# Patient Record
Sex: Female | Born: 1995
Health system: Southern US, Community
[De-identification: ages and names within clinical notes are randomized; demographics above are authoritative.]

## PROBLEM LIST (undated history)

## (undated) DIAGNOSIS — E119 Type 2 diabetes mellitus without complications: Secondary | ICD-10-CM

## (undated) HISTORY — PX: WISDOM TOOTH EXTRACTION: SHX21

---
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
## 2015-10-08 ENCOUNTER — Emergency Department (HOSPITAL_COMMUNITY)
Admission: EM | Admit: 2015-10-08 | Discharge: 2015-10-08 | Disposition: A | Discharge status: Home / Self Care | Payer: Medicaid Other | Attending: Emergency Medicine | Admitting: Emergency Medicine

## 2015-10-08 ENCOUNTER — Encounter (HOSPITAL_COMMUNITY): Payer: Self-pay | Admitting: *Deleted

## 2015-10-08 DIAGNOSIS — Z3202 Encounter for pregnancy test, result negative: Secondary | ICD-10-CM | POA: Insufficient documentation

## 2015-10-08 DIAGNOSIS — E109 Type 1 diabetes mellitus without complications: Secondary | ICD-10-CM | POA: Insufficient documentation

## 2015-10-08 DIAGNOSIS — Z794 Long term (current) use of insulin: Secondary | ICD-10-CM | POA: Insufficient documentation

## 2015-10-08 DIAGNOSIS — R1031 Right lower quadrant pain: Secondary | ICD-10-CM | POA: Diagnosis not present

## 2015-10-08 DIAGNOSIS — Z79899 Other long term (current) drug therapy: Secondary | ICD-10-CM | POA: Insufficient documentation

## 2015-10-08 DIAGNOSIS — M7918 Myalgia, other site: Secondary | ICD-10-CM

## 2015-10-08 HISTORY — DX: Type 2 diabetes mellitus without complications: E11.9

## 2015-10-08 LAB — CBC
HEMATOCRIT: 44.9 % (ref 36.0–46.0)
HEMOGLOBIN: 14.9 g/dL (ref 12.0–15.0)
MCH: 28.7 pg (ref 26.0–34.0)
MCHC: 33.2 g/dL (ref 30.0–36.0)
MCV: 86.3 fL (ref 78.0–100.0)
Platelets: 319 10*3/uL (ref 150–400)
RBC: 5.2 MIL/uL — ABNORMAL HIGH (ref 3.87–5.11)
RDW: 12.7 % (ref 11.5–15.5)
WBC: 12.4 10*3/uL — AB (ref 4.0–10.5)

## 2015-10-08 LAB — URINALYSIS, ROUTINE W REFLEX MICROSCOPIC
Bilirubin Urine: NEGATIVE
Glucose, UA: 100 mg/dL — AB
Hgb urine dipstick: NEGATIVE
Ketones, ur: 15 mg/dL — AB
LEUKOCYTES UA: NEGATIVE
NITRITE: NEGATIVE
PH: 6 (ref 5.0–8.0)
PROTEIN: NEGATIVE mg/dL
SPECIFIC GRAVITY, URINE: 1.014 (ref 1.005–1.030)

## 2015-10-08 LAB — COMPREHENSIVE METABOLIC PANEL
ALBUMIN: 4.5 g/dL (ref 3.5–5.0)
ALT: 15 U/L (ref 14–54)
ANION GAP: 10 (ref 5–15)
AST: 17 U/L (ref 15–41)
Alkaline Phosphatase: 33 U/L — ABNORMAL LOW (ref 38–126)
BILIRUBIN TOTAL: 0.4 mg/dL (ref 0.3–1.2)
BUN: 11 mg/dL (ref 6–20)
CHLORIDE: 104 mmol/L (ref 101–111)
CO2: 24 mmol/L (ref 22–32)
Calcium: 9.8 mg/dL (ref 8.9–10.3)
Creatinine, Ser: 0.81 mg/dL (ref 0.44–1.00)
GFR calc Af Amer: >60 mL/min (ref >60)
Glucose, Bld: 196 mg/dL — ABNORMAL HIGH (ref 65–99)
POTASSIUM: 3.4 mmol/L — AB (ref 3.5–5.1)
Sodium: 138 mmol/L (ref 135–145)
TOTAL PROTEIN: 7.8 g/dL (ref 6.5–8.1)

## 2015-10-08 LAB — LIPASE, BLOOD: LIPASE: 20 U/L (ref 11–51)

## 2015-10-08 LAB — POC URINE PREG, ED: Preg Test, Ur: NEGATIVE

## 2015-10-08 MED ORDER — KETOROLAC TROMETHAMINE 30 MG/ML IJ SOLN
30.0000 mg | Freq: Once | INTRAMUSCULAR | Status: AC
Start: 2015-10-08 — End: 2015-10-08
  Administered 2015-10-08: 30 mg via INTRAMUSCULAR
  Filled 2015-10-08: qty 1

## 2015-10-08 NOTE — Discharge Instructions (Signed)
Take tylenol and motrin for pain control. Return without fail for worsening symptoms, including fever, vomiting and unable to keep down food/fluids, worsening pain, not wanting to eat or drink, or any other symptoms concerning to you.  Abdominal Pain, Adult Many things can cause belly (abdominal) pain. Most times, the belly pain is not dangerous. Many cases of belly pain can be watched and treated at home. HOME CARE   Do not take medicines that help you go poop (laxatives) unless told to by your doctor.  Only take medicine as told by your doctor.  Eat or drink as told by your doctor. Your doctor will tell you if you should be on a special diet. GET HELP IF:  You do not know what is causing your belly pain.  You have belly pain while you are sick to your stomach (nauseous) or have runny poop (diarrhea).  You have pain while you pee or poop.  Your belly pain wakes you up at night.  You have belly pain that gets worse or better when you eat.  You have belly pain that gets worse when you eat fatty foods.  You have a fever. GET HELP RIGHT AWAY IF:   The pain does not go away within 2 hours.  You keep throwing up (vomiting).  The pain changes and is only in the right or left part of the belly.  You have bloody or tarry looking poop. MAKE SURE YOU:   Understand these instructions.  Will watch your condition.  Will get help right away if you are not doing well or get worse.   This information is not intended to replace advice given to you by your health care provider. Make sure you discuss any questions you have with your health care provider.   Document Released: 04/15/2008 Document Revised: 11/18/2014 Document Reviewed: 07/07/2013 Elsevier Interactive Patient Education [employer]2016 Elsevier Inc.

## 2015-10-08 NOTE — ED Provider Notes (Signed)
CSN: [id number]     Arrival date & time 10/08/15  1245 History   First MD Initiated Contact with Patient 10/08/15 1459     Chief Complaint  Patient presents with  . Abdominal Pain     (Consider location/radiation/quality/duration/timing/severity/associated sxs/prior Treatment) HPI 19 year old female who presents with abdominal pain. Has a history of type 1 diabetes and no prior abdominal surgeries. States that yesterday after waking up, she developed right lower quadrant abdominal pain that is crampy and achy in nature. Pain initially worse with movement and position changes, but now is having pain more persistent at rest. Has not had any fever, chills, night sweats, nausea or vomiting, diarrhea, dysuria , urinary frequency, abnormal vaginal discharge or bleeding. Denies any prior exertional activity, trauma or falls, or any known triggers of pain. 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  Pain 7/10 in severity currently.   Past Medical History  Diagnosis Date  . Diabetes mellitus without complication [facility](HCC)    Past Surgical History  Procedure Laterality Date  . Wisdom tooth extraction     History reviewed. No pertinent family history. Social History  Substance Use Topics  . Smoking status: Never Smoker   . Smokeless tobacco: None  . Alcohol Use: No   OB History    No data available     Review of Systems 10/14 systems reviewed and are negative other than those stated in the HPI    Allergies  Morphine and Tetracycline  Home Medications   Prior to Admission medications   Medication Sig Start Date End Date Taking? Authorizing Provider  ibuprofen (ADVIL,MOTRIN) 800 MG tablet Take 800 mg by mouth every 8 (eight) hours as needed (pain).   Yes Historical Provider, MD  Insulin Human (INSULIN PUMP) SOLN Inject 1 each into the skin continuous. Basal rate 1.5   Yes Historical Provider, MD   levothyroxine (SYNTHROID, LEVOTHROID) 50 MCG tablet Take 50 mcg by mouth daily before breakfast.   Yes Historical Provider, MD  promethazine (PHENERGAN) 12.5 MG tablet Take 12.5 mg by mouth every 6 (six) hours as needed for nausea or vomiting.   Yes Historical Provider, MD   BP 108/59 mmHg  Pulse 92  Temp(Src) 98 F (36.7 C) (Oral)  Resp 16  SpO2 99%  LMP  Physical Exam Physical Exam  Nursing note and vitals reviewed. Constitutional: Well developed, well nourished, non-toxic, and in no acute distress Head: Normocephalic and atraumatic.  Mouth/Throat: Oropharynx is clear and moist.  Neck: Normal range of motion. Neck supple.  Cardiovascular: Normal rate and regular rhythm.   Pulmonary/Chest: Effort normal and breath sounds normal.  Abdominal: Soft. Non-distended. No CVA tenderness. Negative murphy's sign. Tender in RLQ.  There is no rebound and no guarding.  Musculoskeletal: Normal range of motion.  Neurological: Alert, no facial droop, fluent speech, moves all extremities symmetrically Skin: Skin is warm and dry.  Psychiatric: Cooperative  ED Course  Procedures (including critical care time) Labs Review Labs Reviewed  COMPREHENSIVE METABOLIC PANEL - Abnormal; Notable for the following:    Potassium 3.4 (*)    Glucose, Bld 196 (*)    Alkaline Phosphatase 33 (*)    All other components within normal limits  CBC - Abnormal; Notable for the following:    WBC 12.4 (*)    RBC 5.20 (*)    All other components within normal limits  URINALYSIS, ROUTINE W REFLEX MICROSCOPIC (NOT AT [facility]) - Abnormal;  Notable for the following:    Glucose, UA 100 (*)    Ketones, ur 15 (*)    All other components within normal limits  LIPASE, BLOOD  POC URINE PREG, ED   I have personally reviewed and evaluated these images and lab results as part of my medical decision-making.    MDM   Final diagnoses:  Right lower quadrant abdominal pain  Musculoskeletal pain    19 year old female with  history of type 1 diabetes who presents with right lower quadrant abdominal pain. On presentation she is well-appearing and in no acute distress. Vital signs are within normal limits. She on exam has a soft and benign abdomen. She does not have tenderness with palpation of her abdomen, and no tenderness at Burney's point. No other secondary signs of appendicitis either. States that her symptoms improve with palpation, and only really noticed her pain with movement of her abdomen. Seems less consistent with acute appendicitis or other acute intra-abdominal process. No pelvic symptoms and no adnexal tenderness and do not suspect pelvic etiology of symptoms. Has an unremarkable CBC, comp metabolic panel, lipase, and urinalysis. Is given a dose of Toradol, with resolution of symptoms. At this time, I do not believe that she requires imaging with CT, but is given her warning signs for appendicitis. Strict return and follow-up instructions are reviewed. She expressed understanding of all discharge instructions and felt comfortable to plan of care.    Lavera Guise, MD 10/09/15 0005

## 2015-10-08 NOTE — ED Notes (Addendum)
Visitor questioning if pt needs IV at this time. Pt denies NVD, tolerating PO food and fluids.

## 2015-10-08 NOTE — ED Notes (Signed)
Here with RLQ pain since yesterday and reports pain with movement.  Pt states achey feeling.  IDDM.

## 2015-10-08 NOTE — ED Notes (Signed)
Called pt for room placement. No answer. 

## 2016-11-12 DIAGNOSIS — E1065 Type 1 diabetes mellitus with hyperglycemia: Secondary | ICD-10-CM | POA: Diagnosis not present

## 2017-01-28 ENCOUNTER — Telehealth: Payer: Self-pay

## 2017-01-28 NOTE — Patient Outreach (Signed)
Called Ms. Jane to schedule a Link to Wellness appointment, there was no answer, left message for her to call me back.

## 2017-02-07 ENCOUNTER — Ambulatory Visit: Payer: Medicaid Other | Admitting: Internal Medicine

## 2017-02-10 ENCOUNTER — Ambulatory Visit: Payer: Medicaid Other | Admitting: Internal Medicine

## 2017-03-14 ENCOUNTER — Telehealth: Payer: Self-pay | Admitting: Behavioral Health

## 2017-03-14 NOTE — Telephone Encounter (Signed)
Unable to reach patient at time of Pre-Visit Call.  Left message for patient to return call when available.    

## 2017-03-16 NOTE — Progress Notes (Addendum)
[facility] [address] [place], [place] [address] [phone]  Date:  03/17/2017   Name:  Yvette Melendez   DOB:  05-17-1996   MRN:  [id number]  PCP:  Pearline Cables, MD    Chief Complaint: Establish Care (Pt here to est care. c/o possible hairline fracture right big toe. Also c/o 2 different episodes of chest pain that started 3 days ago.  Three episodes of dizziness that has happened  over the past  couple of weeks.  Will need refills today. )   History of Present Illness:  Yvette Melendez is a 21 y.o. very pleasant female patient who presents with the following:  Young woman here today to establish care She has DM 1 managed by endocrinology- however pt states that she does not see her last endocrinologist any more.  She had to reschedule her appts too much, and started working for [employer] so she wanted to change to an endocrinologist in our system.  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  She is able to manage this mostly n her own  Currently her novolog is done through her pump- she has a basal rate and carb counts as well Her glucose has been a little harder to control as she is working 12 hour shifts and her eating schedule has changed  She also had a concern about a broken toe - right great toe.  She noted onset of pain 6 weeks ago; however she is not sure of any acute injury. It never swelled or bruised.  However she has noted that this toe has been painful with walking and sometimes at rest for the last 6 weeks  LMP is current  Also, she notes that "a few nights ago I was having stabbing chest pain all night at work, and then a little bit the next day."  This occurred this past Friday- today is Monday. No CP since/ no current CP.  No SOB No fever or cough No history of PE or DVT Never a smoker Not on any OCP  She admits  to feeling stressed. She is not sure if stress has caused her sx  BP Readings from Last 3 Encounters:  03/17/17 118/62  10/08/15 108/59   Patient Active Problem List   Diagnosis Date Noted  . CLOSED LATERAL DISLOCATION OF ELBOW 02/21/2010    Past Medical History:  Diagnosis Date  . Diabetes mellitus without complication [facility])     Past Surgical History:  Procedure Laterality Date  . WISDOM TOOTH EXTRACTION      Social History  Substance Use Topics  . Smoking status: Never Smoker  . Smokeless tobacco: Not on file  . Alcohol use No    No family history on file.  Allergies  Allergen Reactions  . Morphine Nausea And Vomiting  . Tetracycline Other (See Comments)    Unknown    Medication list has been reviewed and updated.  Current Outpatient Prescriptions on File Prior to Visit  Medication Sig Dispense Refill  . ibuprofen (ADVIL,MOTRIN) 800 MG tablet Take 800 mg by mouth every 8 (eight) hours as needed (pain).    . Insulin Human (INSULIN PUMP) SOLN Inject 1 each into the skin continuous. Basal rate 1.5    . levothyroxine (SYNTHROID, LEVOTHROID) 50 MCG tablet Take 50 mcg  by mouth daily before breakfast.    . promethazine (PHENERGAN) 12.5 MG tablet Take 12.5 mg by mouth every 6 (six) hours as needed for nausea or vomiting.     No current facility-administered medications on file prior to visit.     Review of Systems:  As per HPI- otherwise negative.   Physical Examination: Vitals:   03/17/17 1326  BP: 118/62  Pulse: 96  Temp: 97.6 F (36.4 C)   Vitals:   03/17/17 1326  Weight: 162 lb 9.6 oz (73.8 kg)  Height: 5\' 3"  (1.6 m)   Body mass index is 28.8 kg/m. Ideal Body Weight: Weight in (lb) to have BMI = 25: 140.8  GEN: WDWN, NAD, Non-toxic, A & O x 3, overweight, otherwise looks well HEENT: Atraumatic, Normocephalic. Neck supple. No masses, No LAD.  Bilateral TM wnl, oropharynx normal.  PEERL,EOMI.   Ears and Nose: No external deformity. CV: RRR, No  M/G/R. No JVD. No thrill. No extra heart sounds. PULM: CTA B, no wheezes, crackles, rhonchi. No retractions. No resp. distress. No accessory muscle use. ABD: S, NT, ND, +BS. No rebound. No HSM.  Benign belly EXTR: No c/c/e NEURO Normal gait.  PSYCH: Normally interactive. Conversant. Not depressed or anxious appearing.  Calm demeanor.   Dg Chest 2 View  Result Date: 03/17/2017 CLINICAL DATA:  Chest pain EXAM: CHEST  2 VIEW COMPARISON:  None. FINDINGS: Lungs are clear. Heart size and pulmonary vascularity are normal. No adenopathy. No pneumothorax. No bone lesions. IMPRESSION: No abnormality noted. Electronically Signed   By: Bretta BangWilliam  Woodruff III M.D.   On: 03/17/2017 14:35   Dg Foot Complete Right  Result Date: 03/17/2017 CLINICAL DATA:  Pain EXAM: RIGHT FOOT COMPLETE - 3+ VIEW COMPARISON:  None. FINDINGS: Frontal, oblique, and lateral views were obtained. No fracture or dislocation. No appreciable arthropathy. No erosive change. IMPRESSION: No fracture or dislocation.  No appreciable arthropathy. Electronically Signed   By: Bretta BangWilliam  Woodruff III M.D.   On: 03/17/2017 14:36   EKG: sinus rhythm, no concerning findings  Assessment and Plan: Pain of right great toe - Plan: DG Foot Complete Right  Chest pain, unspecified type - Plan: DG Chest 2 View, EKG 12-Lead  Type 1 diabetes mellitus without complications (HCC) - Plan: Ambulatory referral to Endocrinology, CBC, Comprehensive metabolic panel, Hemoglobin A1c  Hypothyroidism due to acquired atrophy of thyroid - Plan: TSH  Here today to establish care and discus several concerns Pt had thought that I could manage her insulin pump- let her know that we will need her to establish with endocrinology, and make a referral for her Due for an A1c and TSH today Toe pain, NKI- will check toe film Concern about CP a few days ago, now resolved.  Offered cardiology referral but she prefers to see if it returns which is reasonable given her age Will check  a CXR for her today  Will plan further follow- up pending labs.   Signed Abbe AmsterdamJessica Blessed Cotham, MD  Received her labs  Results for orders placed or performed in visit on 03/17/17  CBC  Result Value Ref Range   WBC 12.3 (H) 4.0 - 10.5 K/uL   RBC 4.89 3.87 - 5.11 Mil/uL   Platelets 343.0 150.0 - 400.0 K/uL   Hemoglobin 14.0 12.0 - 15.0 g/dL   HCT 16.142.5 09.636.0 - 04.546.0 %   MCV 87.0 78.0 - 100.0 fl   MCHC 32.8 30.0 - 36.0 g/dL   RDW 40.913.3 81.111.5 - 91.415.5 %  Comprehensive metabolic  panel  Result Value Ref Range   Sodium 134 (L) 135 - 145 mEq/L   Potassium 4.0 3.5 - 5.1 mEq/L   Chloride 99 96 - 112 mEq/L   CO2 28 19 - 32 mEq/L   Glucose, Bld 385 (H) 70 - 99 mg/dL   BUN 11 6 - 23 mg/dL   Creatinine, Ser 4.09 0.40 - 1.20 mg/dL   Total Bilirubin 0.4 0.2 - 1.2 mg/dL   Alkaline Phosphatase 28 (L) 39 - 117 U/L   AST 9 0 - 37 U/L   ALT 8 0 - 35 U/L   Total Protein 6.9 6.0 - 8.3 g/dL   Albumin 4.4 3.5 - 5.2 g/dL   Calcium 9.5 8.4 - 81.1 mg/dL   GFR 914.78 >29.56 mL/min  Hemoglobin A1c  Result Value Ref Range   Hgb A1c MFr Bld 8.8 (H) 4.6 - 6.5 %  TSH  Result Value Ref Range   TSH 1.98 0.35 - 4.50 uIU/mL   Message to pt- will need to repeat CBC in one month DM is not controlled- proceed with endocrinology referral TSH is ok

## 2017-03-17 ENCOUNTER — Encounter: Payer: Self-pay | Admitting: Family Medicine

## 2017-03-17 ENCOUNTER — Ambulatory Visit (INDEPENDENT_AMBULATORY_CARE_PROVIDER_SITE_OTHER): Payer: 59 | Admitting: Family Medicine

## 2017-03-17 ENCOUNTER — Ambulatory Visit (HOSPITAL_BASED_OUTPATIENT_CLINIC_OR_DEPARTMENT_OTHER)
Admission: RE | Admit: 2017-03-17 | Discharge: 2017-03-17 | Disposition: A | Discharge status: Home / Self Care | Payer: 59 | Source: Ambulatory Visit | Attending: Family Medicine | Admitting: Family Medicine

## 2017-03-17 VITALS — BP 118/62 | HR 96 | Temp 97.6°F | Ht 63.0 in | Wt 162.6 lb

## 2017-03-17 DIAGNOSIS — M79674 Pain in right toe(s): Secondary | ICD-10-CM

## 2017-03-17 DIAGNOSIS — D72829 Elevated white blood cell count, unspecified: Secondary | ICD-10-CM

## 2017-03-17 DIAGNOSIS — R079 Chest pain, unspecified: Secondary | ICD-10-CM

## 2017-03-17 DIAGNOSIS — M79671 Pain in right foot: Secondary | ICD-10-CM | POA: Diagnosis not present

## 2017-03-17 DIAGNOSIS — E109 Type 1 diabetes mellitus without complications: Secondary | ICD-10-CM

## 2017-03-17 DIAGNOSIS — E034 Atrophy of thyroid (acquired): Secondary | ICD-10-CM

## 2017-03-17 LAB — COMPREHENSIVE METABOLIC PANEL
ALT: 8 U/L (ref 0–35)
AST: 9 U/L (ref 0–37)
Albumin: 4.4 g/dL (ref 3.5–5.2)
Alkaline Phosphatase: 28 U/L — ABNORMAL LOW (ref 39–117)
BUN: 11 mg/dL (ref 6–23)
CALCIUM: 9.5 mg/dL (ref 8.4–10.5)
CHLORIDE: 99 meq/L (ref 96–112)
CO2: 28 meq/L (ref 19–32)
CREATININE: 0.72 mg/dL (ref 0.40–1.20)
GFR: 108.34 mL/min (ref >60.00)
Glucose, Bld: 385 mg/dL — ABNORMAL HIGH (ref 70–99)
POTASSIUM: 4 meq/L (ref 3.5–5.1)
Sodium: 134 mEq/L — ABNORMAL LOW (ref 135–145)
Total Bilirubin: 0.4 mg/dL (ref 0.2–1.2)
Total Protein: 6.9 g/dL (ref 6.0–8.3)

## 2017-03-17 LAB — CBC
HEMATOCRIT: 42.5 % (ref 36.0–46.0)
Hemoglobin: 14 g/dL (ref 12.0–15.0)
MCHC: 32.8 g/dL (ref 30.0–36.0)
MCV: 87 fl (ref 78.0–100.0)
PLATELETS: 343 10*3/uL (ref 150.0–400.0)
RBC: 4.89 Mil/uL (ref 3.87–5.11)
RDW: 13.3 % (ref 11.5–15.5)
WBC: 12.3 10*3/uL — ABNORMAL HIGH (ref 4.0–10.5)

## 2017-03-17 LAB — TSH: TSH: 1.98 u[IU]/mL (ref 0.35–4.50)

## 2017-03-17 LAB — HEMOGLOBIN A1C: HEMOGLOBIN A1C: 8.8 % — AB (ref 4.6–6.5)

## 2017-03-17 NOTE — Addendum Note (Signed)
Addended by: Abbe AmsterdamOPLAND, Christy Friede C on: 03/17/2017 07:56 PM   Modules accepted: Orders

## 2017-03-17 NOTE — Patient Instructions (Signed)
It was nice to see you today- I will be in touch with your labs asap We will also refer you to endocrinology to help us with your insulin pump and diabetes management I will be in touch with your toe and chest films later on today  If you have any more episodes of chest pain please let me know

## 2017-04-14 ENCOUNTER — Telehealth: Payer: Self-pay | Admitting: Family Medicine

## 2017-04-14 ENCOUNTER — Encounter: Payer: Self-pay | Admitting: Family Medicine

## 2017-04-14 NOTE — Telephone Encounter (Signed)
Caller name: Relationship to patient: Self Can be reached: [phone]  Pharmacy:  Reason for call: Patient [facility] so that she can have it drawn while at work today. States they can not see the order in EPIC. Did not have the number but [facility] needs to be [facility].

## 2017-04-14 NOTE — Telephone Encounter (Signed)
Called number- they are not able to see her order in Epic. Will fax an order instead  [phone]   Attn to lab

## 2017-04-14 NOTE — Telephone Encounter (Signed)
Number to [facility] is [phone] to give the verbal

## 2017-04-15 ENCOUNTER — Other Ambulatory Visit (HOSPITAL_COMMUNITY)
Admission: RE | Admit: 2017-04-15 | Discharge: 2017-04-15 | Disposition: A | Discharge status: Home / Self Care | Payer: 59 | Source: Ambulatory Visit | Attending: Family Medicine | Admitting: Family Medicine

## 2017-04-15 DIAGNOSIS — D72829 Elevated white blood cell count, unspecified: Secondary | ICD-10-CM | POA: Insufficient documentation

## 2017-04-15 LAB — CBC
HEMATOCRIT: 41.2 % (ref 36.0–46.0)
HEMOGLOBIN: 14.3 g/dL (ref 12.0–15.0)
MCH: 29.2 pg (ref 26.0–34.0)
MCHC: 34.7 g/dL (ref 30.0–36.0)
MCV: 84.3 fL (ref 78.0–100.0)
Platelets: 367 10*3/uL (ref 150–400)
RBC: 4.89 MIL/uL (ref 3.87–5.11)
RDW: 12.3 % (ref 11.5–15.5)
WBC: 10.6 10*3/uL — AB (ref 4.0–10.5)

## 2017-04-18 MED FILL — PROMETHAZINE 12.5 MG TABLET: 12.5 | 5 days supply | Qty: 20 | Fill #0

## 2017-04-18 MED FILL — LANTUS 100 UNITS/ML VIAL: 100 | 28 days supply | Qty: 10 | Fill #0

## 2017-04-21 ENCOUNTER — Telehealth: Payer: Self-pay | Admitting: Family Medicine

## 2017-04-21 NOTE — Telephone Encounter (Signed)
Relation to VQ:QVZDpt:self Call back number:[phone] Pharmacy: [facility] [place], [place] - [address] [phone]) [phone])     Reason for call:  Patient states insurance will not cover insulin aspart (NOVOLOG) 100 UNIT/ML injection and would like PCP to prescribe "humalog" and requesting ibuprofen (ADVIL,MOTRIN) 800 MG tablet, please advise

## 2017-04-22 MED ORDER — INSULIN LISPRO 100 UNIT/ML ~~LOC~~ SOLN: SUBCUTANEOUS | 11 refills | Status: DC

## 2017-04-22 MED ORDER — IBUPROFEN 800 MG PO TABS: 800.0000 mg | ORAL_TABLET | Freq: Three times a day (TID) | ORAL | 3 refills | Status: DC | PRN

## 2017-04-22 NOTE — Telephone Encounter (Signed)
Called pt to clarity- she is using novolog in her pump, but this is no longer covered.  Per pharmacy humalog is a covered option for her.  humalog and novolog do have the same rate of onset and halt life, so this substitution should be fine.  Clarified with pt how much she needs per month and did rx for her.  Also refilled her ibuprofen 800 to use prn  Meds ordered this encounter  Medications  . insulin lispro (HUMALOG) 100 UNIT/ML injection    Sig: Use in insulin pump as directed- up to 80 units a day    Dispense:  30 mL    Refill:  11  . ibuprofen (ADVIL,MOTRIN) 800 MG tablet    Sig: Take 1 tablet (800 mg total) by mouth every 8 (eight) hours as needed (pain).    Dispense:  60 tablet    Refill:  3

## 2017-04-25 ENCOUNTER — Other Ambulatory Visit: Payer: Self-pay | Admitting: Emergency Medicine

## 2017-04-25 ENCOUNTER — Telehealth: Payer: Self-pay | Admitting: Emergency Medicine

## 2017-04-25 MED ORDER — IBUPROFEN 800 MG PO TABS: 800.0000 mg | ORAL_TABLET | Freq: Three times a day (TID) | ORAL | 3 refills | Status: DC | PRN

## 2017-04-25 MED ORDER — INSULIN LISPRO 100 UNIT/ML ~~LOC~~ SOLN: SUBCUTANEOUS | 11 refills | Status: DC

## 2017-04-25 MED FILL — IBUPROFEN 800 MG TAB: 800 | 20 days supply | Qty: 60 | Fill #0

## 2017-04-25 MED FILL — HumaLOG 100 UNIT/ML SOLN: 100 | 25 days supply | Qty: 20 | Fill #0

## 2017-04-25 NOTE — Telephone Encounter (Signed)
Received ref[facility] from [facility] for insulin lispro (HUMALOG)100 UNIT/ML injeciton and ibuprofen (ADVIL, MOTRIN) 800 MG tablet. Both were filled on 04/22/17 and sent to [facility]. Called pt to find out if she has meds already or if they need to be sent to [facility].  Called pt, no answer left message for pt to return call to inform if I need to disregard the request received from [facility].

## 2017-04-25 NOTE — Telephone Encounter (Signed)
Patient returned call and states she prefers [facility], as of yet she has not received Rx from [facility] . Patient said Thank You So much.

## 2017-04-25 NOTE — Telephone Encounter (Signed)
Called [facility] to see if prescriptions had been mailed to pt yet. Informed by [facility] that meds have not been shipped and were awaiting pt to complete registration process. Informed [facility] that pt will pick up from local pharmacy. [facility] asked me to  Inform pt that she will need to call in to complete registration in order to have future prescriptions mailed.   Called and informed pt. Pt verbalized understanding and will call [facility] to complete registration. [facility] as requested by pt.

## 2017-04-29 ENCOUNTER — Encounter: Payer: Self-pay | Admitting: Endocrinology

## 2017-04-29 MED FILL — AZITHROMYCIN 250 MG TAB: 250 | 5 days supply | Qty: 6 | Fill #0

## 2017-04-29 MED FILL — FLUTICASONE PROP 50 MCG SPR: 50 | 30 days supply | Qty: 16 | Fill #0

## 2017-08-15 ENCOUNTER — Encounter: Payer: Self-pay | Admitting: Family Medicine

## 2017-08-19 NOTE — Progress Notes (Signed)
[place] at [facility] [address], [place], [place] [address] [phone]  Date:  08/21/2017   Name:  Yvette Melendez   DOB:  [birth year]   MRN:  [id number]  PCP:  Darreld Mclean, MD    Chief Complaint: Contraception   History of Present Illness:  Yvette Melendez is a 21 y.o. very pleasant female patient who presents with the following: I last saw her in May of this year:  Young woman here today to establish care She has DM 1 managed by endocrinology- however pt states that she does not see her last endocrinologist any more.  She had to reschedule her appts too much, and started working for [employer] so she wanted to change to an endocrinologist in our system.  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  She is able to manage this mostly n her own  Currently her novolog is done through her pump- she has a basal rate and carb counts as well Her glucose has been a little harder to control as she is working 12 hour shifts and her eating schedule has changed  She plans to call endocrinologyto discuss her diabetes Lab Results  Component Value Date   HGBA1C 8.8 (H) 03/17/2017    Here today to discuss contraception- she has never used hormonal contraception in the past but would like to start OCP. She and her SO are planning to have intercourse- they have not done so as of yet.  Pap: none yet as she has not yet had intercourse   Menses can be irregular- occur every 2-3 months.  She will have cramping, pain, nausea when she has her menses - she hopes that OCP will help with these sx as well  She has not had any cancers, never had a DVT/ PE, or migraine with aura, never a smoker  LMP was last month.    She needs a couple of refills today- phenergan and her glucacon emergency kit She does plan to establish care with endocrinology.  She plans to  have an A1c drawn at her work-place soon; I gave her an order for this   Patient Active Problem List   Diagnosis Date Noted  . CLOSED LATERAL DISLOCATION OF ELBOW 02/21/2010    Past Medical History:  Diagnosis Date  . Diabetes mellitus without complication [facility])     Past Surgical History:  Procedure Laterality Date  . WISDOM TOOTH EXTRACTION      Social History  Substance Use Topics  . Smoking status: Never Smoker  . Smokeless tobacco: Never Used  . Alcohol use No    No family history on file.  Allergies  Allergen Reactions  . Morphine Nausea And Vomiting  . Tetracycline Other (See Comments)    Unknown    Medication list has been reviewed and updated.  Current Outpatient Prescriptions on File Prior to Visit  Medication Sig Dispense Refill  . ibuprofen (ADVIL,MOTRIN) 800 MG tablet Take 1 tablet (800 mg total) by mouth every 8 (eight) hours as needed (pain). 60 tablet 3  . insulin glargine (LANTUS) 100 UNIT/ML injection INJECT 17 UNITS UNDER THE SKIN DAILY ONLY IN CASE OF PUMP MALFUNCTION    . Insulin Human (INSULIN PUMP) SOLN Inject 1 each into the skin continuous. Basal rate 1.5    . insulin lispro (HUMALOG)  100 UNIT/ML injection Use in insulin pump as directed- up to 80 units a day 30 mL 11  . levothyroxine (SYNTHROID, LEVOTHROID) 50 MCG tablet Take 50 mcg by mouth daily before breakfast.     No current facility-administered medications on file prior to visit.     Review of Systems:  As per HPI- otherwise negative. No fever or chills  BP Readings from Last 3 Encounters:  08/21/17 130/80  03/17/17 118/62  10/08/15 108/59      Physical Examination: Vitals:   08/21/17 1348  BP: 130/80  Pulse: 100  Temp: 98.3 F (36.8 C)  SpO2: 98%   Vitals:   08/21/17 1348  Weight: 162 lb (73.5 kg)  Height: 5' 3"  (1.6 m)   Body mass index is 28.7 kg/m. Ideal Body Weight: Weight in (lb) to have BMI = 25: 140.8  GEN: WDWN, NAD, Non-toxic, A & O x 3, overweight,  looks well HEENT: Atraumatic, Normocephalic. Neck supple. No masses, No LAD. Ears and Nose: No external deformity. CV: RRR, No M/G/R. No JVD. No thrill. No extra heart sounds. PULM: CTA B, no wheezes, crackles, rhonchi. No retractions. No resp. distress. No accessory muscle use. ABD: S, NT, ND, +BS. No rebound. No HSM. EXTR: No c/c/e NEURO Normal gait.  PSYCH: Normally interactive. Conversant. Not depressed or anxious appearing.  Calm demeanor.   Results for orders placed or performed in visit on 08/21/17  POCT urine pregnancy  Result Value Ref Range   Preg Test, Ur Negative Negative    Assessment and Plan: Contraceptive use education - Plan: POCT urine pregnancy, levonorgestrel-ethinyl estradiol (AVIANE,ALESSE,LESSINA) 0.1-20 MG-MCG tablet  Type 1 diabetes mellitus without complications ([name]) - Plan: glucagon 1 MG injection  Nausea - Plan: promethazine (PHENERGAN) 12.5 MG tablet  Would like to start on OCP- discussed in detail with her. She will take once daily,and recommended condoms as well for back-up protection.  She will come and see [place] for a pap once she is sexually active   Signed Lamar Blinks, MD

## 2017-08-21 ENCOUNTER — Ambulatory Visit (INDEPENDENT_AMBULATORY_CARE_PROVIDER_SITE_OTHER): Payer: 59 | Admitting: Family Medicine

## 2017-08-21 ENCOUNTER — Encounter: Payer: Self-pay | Admitting: Family Medicine

## 2017-08-21 VITALS — BP 130/80 | HR 100 | Temp 98.3°F | Ht 63.0 in | Wt 162.0 lb

## 2017-08-21 DIAGNOSIS — R110 Nausea: Secondary | ICD-10-CM

## 2017-08-21 DIAGNOSIS — E109 Type 1 diabetes mellitus without complications: Secondary | ICD-10-CM

## 2017-08-21 DIAGNOSIS — Z3009 Encounter for other general counseling and advice on contraception: Secondary | ICD-10-CM

## 2017-08-21 LAB — POCT URINE PREGNANCY: PREG TEST UR: NEGATIVE

## 2017-08-21 MED ORDER — GLUCAGON (RDNA) 1 MG IJ KIT: 1.0000 mg | PACK | Freq: Once | INTRAMUSCULAR | 12 refills | Status: AC | PRN

## 2017-08-21 MED ORDER — PROMETHAZINE HCL 12.5 MG PO TABS: 12.5000 mg | ORAL_TABLET | Freq: Four times a day (QID) | ORAL | 1 refills | Status: DC | PRN

## 2017-08-21 MED ORDER — LEVONORGESTREL-ETHINYL ESTRAD 0.1-20 MG-MCG PO TABS: 1.0000 | ORAL_TABLET | Freq: Every day | ORAL | 4 refills | Status: DC

## 2017-08-21 MED FILL — GLUCAGON 1 MG EMERGENCY KIT: 1 | 1 days supply | Qty: 1 | Fill #0

## 2017-08-21 MED FILL — PROMETHAZINE 12.5 MG TABLET: 12.5 | 7 days supply | Qty: 30 | Fill #0

## 2017-08-21 MED FILL — HumaLOG 100 UNIT/ML SOLN: 100 | 25 days supply | Qty: 20 | Fill #1

## 2017-08-21 MED FILL — IBUPROFEN 800 MG TABS: 800 | 20 days supply | Qty: 60 | Fill #1

## 2017-08-21 MED FILL — LEVONOR-ETH ESTRAD 0.1-0.02: 0.1-20 | 84 days supply | Qty: 84 | Fill #0

## 2017-08-21 NOTE — Patient Instructions (Addendum)
We will have you start on birth control pills- take one daily at about the same time Let me know if you have any problems or concerns!   We can do your pap for you in a few months

## 2017-10-06 MED FILL — PROMETHAZINE 12.5 MG TABLET: 12.5 | 7 days supply | Qty: 30 | Fill #1

## 2017-10-06 MED FILL — IBUPROFEN 800 MG TAB: 800 | 20 days supply | Qty: 60 | Fill #2

## 2017-10-06 MED FILL — HumaLOG 100 UNIT/ML SOLN: 100 | 25 days supply | Qty: 20 | Fill #2

## 2017-10-23 ENCOUNTER — Telehealth: Payer: Self-pay | Admitting: Family Medicine

## 2017-10-23 ENCOUNTER — Other Ambulatory Visit: Payer: Self-pay | Admitting: Family Medicine

## 2017-10-23 DIAGNOSIS — R110 Nausea: Secondary | ICD-10-CM

## 2017-10-23 DIAGNOSIS — Z3009 Encounter for other general counseling and advice on contraception: Secondary | ICD-10-CM

## 2017-10-23 MED ORDER — LEVONORGESTREL-ETHINYL ESTRAD 0.1-20 MG-MCG PO TABS: 1.0000 | ORAL_TABLET | Freq: Every day | ORAL | 4 refills | Status: AC

## 2017-10-23 NOTE — Telephone Encounter (Signed)
Pt requesting refill on lorazepam. Please advise.

## 2017-10-23 NOTE — Telephone Encounter (Signed)
Called her back-  She is not on ativan.  It is actually her OCP that she needs. She takes this on a continuou basis (skips the placebo) and this causes her to run out before her 90 days are up. I will rx 4 packs for a 90 day supply for her in resolve this issue

## 2017-10-23 NOTE — Telephone Encounter (Signed)
Controlled substance 

## 2017-10-23 NOTE — Telephone Encounter (Signed)
Copied from CRM #[id number]. Topic: Quick Communication - Rx Refill/Question >> Oct 23, 2017  9:14 AM Clack, Princella PellegriniJessica D wrote: Has the patient contacted their pharmacy? Yes.     (Agent: If no, request that the patient contact the pharmacy for the refill.)   Preferred Pharmacy (with phone number or street name): [facility] - [place], [place] - [address] [phone] (Phone) [phone] (Fax)  Pt states she would like to know if [facility] could update her refill, she is only able to refill them every 21 days and states that she does not have enough to last her the 21 days. LORazepam (ATIVAN) 1 MG tablet [[id number]][[id number]]    Agent: Please be advised that RX refills may take up to 3 business days. We ask that you follow-up with your pharmacy.

## 2017-10-26 MED FILL — HumaLOG 100 UNIT/ML SOLN: 100 | 25 days supply | Qty: 20 | Fill #3

## 2017-11-03 MED FILL — IBUPROFEN 800 MG TAB: 800 | 20 days supply | Qty: 60 | Fill #3

## 2017-11-03 MED FILL — PROMETHAZINE 12.5 MG TABLET: 12.5 | 7 days supply | Qty: 30 | Fill #0

## 2017-11-03 MED FILL — LEVONOR-ETH ESTRAD 0.1-0.02: 0.1-20 | 84 days supply | Qty: 84 | Fill #1

## 2017-11-12 MED FILL — AZITHROMYCIN 250 MG TAB: 250 | 5 days supply | Qty: 6 | Fill #0

## 2017-11-21 ENCOUNTER — Telehealth: Payer: Self-pay | Admitting: *Deleted

## 2017-11-21 NOTE — Telephone Encounter (Signed)
Received Physician Orders from [employer] for CMN for replacement pump; forwarded to provider/SLS 01/11

## 2017-11-25 ENCOUNTER — Encounter: Payer: Self-pay | Admitting: Family Medicine

## 2017-12-06 NOTE — Progress Notes (Addendum)
[facility] [address], [address] [place], [place] [address] [phone] [phone]  Date:  12/08/2017   Name:  Yvette GrosserMegan A Karabin   DOB:  Dec 15, 1995   MRN:  [id number]  PCP:  Pearline Cablesopland, Khadeja Abt C, MD    Chief Complaint: Diabetes (Pt here to f/ )   History of Present Illness:  Yvette GrosserMegan A Lamar is a 22 y.o. very pleasant female patient who presents with the following:  Pt with DM1 on an insulin pump.  I have seen her a couple of times over the last 12 months - May and October of 2018.  At both visits she stated that she planned to re-establish with endocrinology soon (I placed a referral for her in May) but has not done so. Also did not get her A1c drawn as we had discussed.  I recently received forms from [employer] to re-order an insulin pump for her.  I called pt to discuss need for endocrinology care, as I am not comfortable managing her insulin pump long term.  Will go through forms with her today so she will not go untreated.   She is changing to a brand new type of pump - her current pump is being discontinued in the [place], and the company sent her forms to she can get a similar pump from a different manufacturer She is overdue for labs today as well   Lab Results  Component Value Date   HGBA1C 8.8 (H) 03/17/2017   Lab Results  Component Value Date   TSH 1.98 03/17/2017     Patient Active Problem List   Diagnosis Date Noted  . CLOSED LATERAL DISLOCATION OF ELBOW 02/21/2010    Past Medical History:  Diagnosis Date  . Diabetes mellitus without complication [facility](HCC)     Past Surgical History:  Procedure Laterality Date  . WISDOM TOOTH EXTRACTION      Social History   Tobacco Use  . Smoking status: Never Smoker  . Smokeless tobacco: Never Used  Substance Use Topics  . Alcohol use: No  . Drug use: No    History reviewed. No pertinent family history.  Allergies  Allergen Reactions  . Morphine Nausea And Vomiting  .  Tetracycline Other (See Comments)    Unknown    Medication list has been reviewed and updated.  Current Outpatient Medications on File Prior to Visit  Medication Sig Dispense Refill  . glucagon 1 MG injection Inject 1 mg into the muscle once as needed. 1 each 12  . ibuprofen (ADVIL,MOTRIN) 800 MG tablet Take 1 tablet (800 mg total) by mouth every 8 (eight) hours as needed (pain). 60 tablet 3  . insulin glargine (LANTUS) 100 UNIT/ML injection INJECT 17 UNITS UNDER THE SKIN DAILY ONLY IN CASE OF PUMP MALFUNCTION    . Insulin Human (INSULIN PUMP) SOLN Inject 1 each into the skin continuous. Basal rate 1.5    . levonorgestrel-ethinyl estradiol (AVIANE,ALESSE,LESSINA) 0.1-20 MG-MCG tablet Take 1 tablet by mouth daily. Pt does not use placebo pills.  Please dispense 4 packs for a 90 day supply 4 Package 4  . levothyroxine (SYNTHROID, LEVOTHROID) 50 MCG tablet Take 50 mcg by mouth daily before breakfast.    . promethazine (PHENERGAN) 12.5 MG tablet TAKE 1 TABLET BY MOUTH EVERY 6 HOURS AS NEEDED FOR NAUSEA OR VOMITING 30 tablet 1   No current facility-administered medications on file prior to visit.     Review of Systems:  As per HPI-  otherwise negative.   Physical Examination: Vitals:   12/08/17 1014  BP: 112/72  Pulse: (!) 106  Temp: 98 F (36.7 C)  SpO2: 99%   Vitals:   12/08/17 1014  Weight: 163 lb 3.2 oz (74 kg)   Body mass index is 28.91 kg/m. Ideal Body Weight:    GEN: WDWN, NAD, Non-toxic, A & O x 3, overweight, looks well  HEENT: Atraumatic, Normocephalic. Neck supple. No masses, No LAD. Ears and Nose: No external deformity. CV: RRR, No M/G/R. No JVD. No thrill. No extra heart sounds. PULM: CTA B, no wheezes, crackles, rhonchi. No retractions. No resp. distress. No accessory muscle use. ABD: S, NT, ND, +BS. No rebound. No HSM. EXTR: No c/c/e NEURO Normal gait.  PSYCH: Normally interactive. Conversant. Not depressed or anxious appearing.  Calm demeanor.     Assessment and Plan: Type 1 diabetes mellitus without complications (HCC) - Plan: Hemoglobin A1c, Ambulatory referral to Endocrinology, Basic metabolic panel, insulin lispro (HUMALOG) 100 UNIT/ML injection  Hypothyroidism due to acquired atrophy of thyroid - Plan: TSH  Leukocytosis, unspecified type - Plan: CBC  Completed insulin and CBG monitoring for pt today- will fax in for her Labs pending as above Referral back to endocrinology as she did not end up being seen the last time   Signed Abbe Amsterdam, MD  Received her labs  Results for orders placed or performed in visit on 12/08/17  Hemoglobin A1c  Result Value Ref Range   Hgb A1c MFr Bld 9.2 (H) 4.6 - 6.5 %  TSH  Result Value Ref Range   TSH 1.60 0.35 - 4.50 uIU/mL  Basic metabolic panel  Result Value Ref Range   Sodium 138 135 - 145 mEq/L   Potassium 4.0 3.5 - 5.1 mEq/L   Chloride 101 96 - 112 mEq/L   CO2 27 19 - 32 mEq/L   Glucose, Bld 217 (H) 70 - 99 mg/dL   BUN 13 6 - 23 mg/dL   Creatinine, Ser 9.56 0.40 - 1.20 mg/dL   Calcium 9.2 8.4 - 21.3 mg/dL   GFR 086.57 >84.69 mL/min  CBC  Result Value Ref Range   WBC 8.9 4.0 - 10.5 K/uL   RBC 5.14 (H) 3.87 - 5.11 Mil/uL   Platelets 321.0 150.0 - 400.0 K/uL   Hemoglobin 15.0 12.0 - 15.0 g/dL   HCT 62.9 52.8 - 41.3 %   MCV 87.9 78.0 - 100.0 fl   MCHC 33.3 30.0 - 36.0 g/dL   RDW 24.4 01.0 - 27.2 %   Message to pt Your blood count and metabolic profile are ok except for high blood sugar.  Your thyroid is normal Your A1c is too high- we will get you set up to see endocrinology asap to help [place] manage your diabetes.

## 2017-12-08 ENCOUNTER — Encounter: Payer: Self-pay | Admitting: Family Medicine

## 2017-12-08 ENCOUNTER — Other Ambulatory Visit: Payer: Self-pay | Admitting: Family Medicine

## 2017-12-08 ENCOUNTER — Ambulatory Visit (INDEPENDENT_AMBULATORY_CARE_PROVIDER_SITE_OTHER): Payer: 59 | Admitting: Family Medicine

## 2017-12-08 VITALS — BP 112/72 | HR 86 | Temp 98.0°F | Wt 163.2 lb

## 2017-12-08 DIAGNOSIS — E109 Type 1 diabetes mellitus without complications: Secondary | ICD-10-CM

## 2017-12-08 DIAGNOSIS — E034 Atrophy of thyroid (acquired): Secondary | ICD-10-CM | POA: Diagnosis not present

## 2017-12-08 DIAGNOSIS — D72829 Elevated white blood cell count, unspecified: Secondary | ICD-10-CM | POA: Diagnosis not present

## 2017-12-08 LAB — CBC
HCT: 45.2 % (ref 36.0–46.0)
Hemoglobin: 15 g/dL (ref 12.0–15.0)
MCHC: 33.3 g/dL (ref 30.0–36.0)
MCV: 87.9 fl (ref 78.0–100.0)
PLATELETS: 321 10*3/uL (ref 150.0–400.0)
RBC: 5.14 Mil/uL — ABNORMAL HIGH (ref 3.87–5.11)
RDW: 12.7 % (ref 11.5–15.5)
WBC: 8.9 10*3/uL (ref 4.0–10.5)

## 2017-12-08 LAB — BASIC METABOLIC PANEL
BUN: 13 mg/dL (ref 6–23)
CALCIUM: 9.2 mg/dL (ref 8.4–10.5)
CO2: 27 meq/L (ref 19–32)
CREATININE: 0.68 mg/dL (ref 0.40–1.20)
Chloride: 101 mEq/L (ref 96–112)
GFR: 114.95 mL/min (ref >60.00)
GLUCOSE: 217 mg/dL — AB (ref 70–99)
Potassium: 4 mEq/L (ref 3.5–5.1)
SODIUM: 138 meq/L (ref 135–145)

## 2017-12-08 LAB — TSH: TSH: 1.6 u[IU]/mL (ref 0.35–4.50)

## 2017-12-08 LAB — HEMOGLOBIN A1C: Hgb A1c MFr Bld: 9.2 % — ABNORMAL HIGH (ref 4.6–6.5)

## 2017-12-08 MED ORDER — INSULIN LISPRO 100 UNIT/ML ~~LOC~~ SOLN: SUBCUTANEOUS | 11 refills | Status: DC

## 2017-12-08 MED FILL — HumaLOG 100 UNIT/ML SOLN: 100 | 37 days supply | Qty: 30 | Fill #0

## 2017-12-08 MED FILL — PROMETHAZINE 12.5 MG TABLET: 12.5 | 7 days supply | Qty: 30 | Fill #1

## 2017-12-08 NOTE — Telephone Encounter (Signed)
[employer] checking status, please call back [phone] Ext [phone], Annice PihJackie

## 2017-12-08 NOTE — Patient Instructions (Signed)
It was good to see you today-  I am going to refer you to endocrinology  I will be in touch with your labs asap  Take care and let me know if any questions

## 2017-12-12 ENCOUNTER — Other Ambulatory Visit: Payer: Self-pay | Admitting: Family Medicine

## 2017-12-12 NOTE — Telephone Encounter (Signed)
Called them

## 2017-12-12 NOTE — Telephone Encounter (Signed)
They did not actually need anything from me

## 2017-12-12 NOTE — Telephone Encounter (Signed)
Received refill request for ibuprofen (ADVIL, MOTRIN) 800 MG tablet. Is it ok to refill? Please advise.

## 2018-01-06 ENCOUNTER — Other Ambulatory Visit: Payer: Self-pay | Admitting: Family Medicine

## 2018-01-06 DIAGNOSIS — R110 Nausea: Secondary | ICD-10-CM

## 2018-01-06 MED FILL — IBUPROFEN 800 MG TAB: 800 | 20 days supply | Qty: 60 | Fill #0

## 2018-01-06 MED FILL — HumaLOG 100 UNIT/ML SOLN: 100 | 37 days supply | Qty: 30 | Fill #1

## 2018-01-06 MED FILL — PROMETHAZINE 12.5 MG TABLET: 12.5 | 7 days supply | Qty: 30 | Fill #0

## 2018-01-20 ENCOUNTER — Ambulatory Visit (INDEPENDENT_AMBULATORY_CARE_PROVIDER_SITE_OTHER): Payer: 59 | Admitting: Endocrinology

## 2018-01-20 ENCOUNTER — Encounter: Payer: Self-pay | Admitting: Endocrinology

## 2018-01-20 DIAGNOSIS — E109 Type 1 diabetes mellitus without complications: Secondary | ICD-10-CM

## 2018-01-20 NOTE — Patient Instructions (Addendum)
good diet and exercise significantly improve the control of your diabetes.  please let me know if you wish to be referred to a dietician.  high blood sugar is very risky to your health.  you should see an eye doctor and dentist every year.  It is very important to get all recommended vaccinations.  Controlling your blood pressure and cholesterol drastically reduces the damage diabetes does to your body.  Those who smoke should quit.  Please discuss these with your doctor.  check your blood sugar 4 times a day.  vary the time of day when you check, between before the 3 meals, and at bedtime.  also check if you have symptoms of your blood sugar being too high or too low.  please keep a record of the readings and bring it to your next appointment here (or you can bring the meter itself).  You can write it on any piece of paper.  please call us sooner if your blood sugar goes below 70, or if you have a lot of readings over 200. You should sign up for "Link to Wellness."  Then they pay for your pump supplies and continuous glucose monitors.  If you get the continuous glucose monitor, Bonita QuinLinda would be happy to help you.   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 Take a bolus of 1 unit/10 grams carbohydrate.   continue correction bolus (which some people call "sensitivity," or "insulin sensitivity ratio," or just "isr") of 1 unit for each 25 by which your glucose exceeds 120.   In view of your medical condition, you should avoid pregnancy until we have decided it is safe.   Please come back for a follow-up appointment in 2 months.

## 2018-01-20 NOTE — Progress Notes (Signed)
Subjective:    Patient ID: Yvette Melendez, female    DOB: 11-07-1996, 22 y.o.   MRN: [id number]  HPI pt is referred by Dr Patsy Lageropland, for diabetes.  Pt states DM was dx'ed in 2012; she has mild if any neuropathy of the lower extremities; he is unaware of any associated chronic complications; she has been on insulin since dx, and pump rx since 2013; pt says her diet and exercise are good; she has never had pancreatitis, pancreatic surgery, severe hypoglycemia or DKA.  She works at [employer], and [employer], both 7P-7A.  However, she says glycose is higher when she works at [employer] (less activity).  She has mild hypoglycemia approx once per week.  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  She takes these pump settings:  basal rate of 2 units/hr.   bolus of 1 unit/10 grams carbohydrate.   correction bolus (which some people call "sensitivity," or "insulin sensitivity ratio," or just "isr") of 1 unit for each 25 by which glucose exceeds 120.   TDD= 75 units/day.   Past Medical History:  Diagnosis Date  . Diabetes mellitus without complication [facility](HCC)     Past Surgical History:  Procedure Laterality Date  . WISDOM TOOTH EXTRACTION      Social History   Socioeconomic History  . Marital status: Single    Spouse name: Not on file  . Number of children: Not on file  . Years of education: Not on file  . Highest education level: Not on file  Social Needs  . Financial resource strain: Not on file  . Food insecurity - worry: Not on file  . Food insecurity - inability: Not on file  . Transportation needs - medical: Not on file  . Transportation needs - non-medical: Not on file  Occupational History  . Not on file  Tobacco Use  . Smoking status: Never Smoker  . Smokeless tobacco: Never Used  Substance and Sexual Activity  . Alcohol use: No  . Drug use: No  . Sexual activity: Not on file  Other Topics Concern  . Not on file  Social History Narrative  . Not on file    Current Outpatient Medications on File Prior  to Visit  Medication Sig Dispense Refill  . glucagon 1 MG injection Inject 1 mg into the muscle once as needed. 1 each 12  . ibuprofen (ADVIL,MOTRIN) 800 MG tablet TAKE 1 TABLET BY MOUTH EVERY 8 HOURS AS NEEDED FOR PAIN 60 tablet 3  . insulin glargine (LANTUS) 100 UNIT/ML injection INJECT 17 UNITS UNDER THE SKIN DAILY ONLY IN CASE OF PUMP MALFUNCTION    . Insulin Human (INSULIN PUMP) SOLN Inject 1 each into the skin continuous. Basal rate 1.5    . insulin lispro (HUMALOG) 100 UNIT/ML injection Use in insulin pump as directed- up to 80 units a day 30 mL 11  . levonorgestrel-ethinyl estradiol (AVIANE,ALESSE,LESSINA) 0.1-20 MG-MCG tablet Take 1 tablet by mouth daily. Pt does not use placebo pills.  Please dispense 4 packs for a 90 day supply 4 Package 4  . levothyroxine (SYNTHROID, LEVOTHROID) 50 MCG tablet Take 50 mcg by mouth daily before breakfast.    . promethazine (PHENERGAN) 12.5 MG tablet Take 1 tablet (12.5 mg total) by mouth every 6 (six) hours as needed for nausea or vomiting. 30 tablet 1   No current facility-administered medications on file prior to visit.     Allergies  Allergen Reactions  . Morphine Nausea And Vomiting  .  Tetracycline Other (See Comments)    Unknown    Family History  Problem Relation Age of Onset  . Diabetes Neg Hx     BP 110/80 (BP Location: Left Arm, Patient Position: Sitting, Cuff Size: Normal)   Pulse 97   Wt 167 lb (75.8 kg)   SpO2 99%   BMI 29.58 kg/m    Review of Systems denies blurry vision, chest pain, sob, n/v, urinary frequency, muscle cramps, excessive diaphoresis, depression, cold intolerance, rhinorrhea, and easy bruising.  She has lost weight, due to her efforts. She has intermitt headache.       Objective:   Physical Exam VS: see vs page GEN: no distress HEAD: head: no deformity eyes: no periorbital swelling, no proptosis external nose and ears are normal mouth: no lesion seen NECK: supple, thyroid is not enlarged CHEST WALL:  no deformity LUNGS: clear to auscultation CV: reg rate and rhythm, no murmur ABD: abdomen is soft, nontender.  no hepatosplenomegaly.  not distended.  no hernia MUSCULOSKELETAL: muscle bulk and strength are grossly normal.  no obvious joint swelling.  gait is normal and steady EXTEMITIES: no deformity.  no ulcer on the feet.  feet are of normal color and temp.  no edema PULSES: dorsalis pedis intact bilat.  no carotid bruit NEURO:  cn 2-12 grossly intact.   readily moves all 4's.  sensation is intact to touch on the feet SKIN:  Normal texture and temperature.  No rash or suspicious lesion is visible.   NODES:  None palpable at the neck PSYCH: alert, well-oriented.  Does not appear anxious nor depressed.   Lab Results  Component Value Date   HGBA1C 9.2 (H) 12/08/2017   Lab Results  Component Value Date   CREATININE 0.68 12/08/2017   BUN 13 12/08/2017   NA 138 12/08/2017   K 4.0 12/08/2017   CL 101 12/08/2017   CO2 27 12/08/2017   I have reviewed outside records, and summarized: Pt was noted to have elevated a1c, and referred here several times over the past year, but has not been able to make it until now.      Assessment & Plan:  Type 1 DM, new to me: she needs increased rx   Patient Instructions  good diet and exercise significantly improve the control of your diabetes.  please let me know if you wish to be referred to a dietician.  high blood sugar is very risky to your health.  you should see an eye doctor and dentist every year.  It is very important to get all recommended vaccinations.  Controlling your blood pressure and cholesterol drastically reduces the damage diabetes does to your body.  Those who smoke should quit.  Please discuss these with your doctor.  check your blood sugar 4 times a day.  vary the time of day when you check, between before the 3 meals, and at bedtime.  also check if you have symptoms of your blood sugar being too high or too low.  please keep a  record of the readings and bring it to your next appointment here (or you can bring the meter itself).  You can write it on any piece of paper.  please call us sooner if your blood sugar goes below 70, or if you have a lot of readings over 200. You should sign up for "Link to Wellness."  Then they pay for your pump supplies and continuous glucose monitors.  If you get the continuous glucose monitor, Bonita Quin would  be happy to help you.   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 Take a bolus of 1 unit/10 grams carbohydrate.   continue correction bolus (which some people call "sensitivity," or "insulin sensitivity ratio," or just "isr") of 1 unit for each 25 by which your glucose exceeds 120.   In view of your medical condition, you should avoid pregnancy until we have decided it is safe.   Please come back for a follow-up appointment in 2 months.

## 2018-01-22 DIAGNOSIS — E119 Type 2 diabetes mellitus without complications: Secondary | ICD-10-CM | POA: Insufficient documentation

## 2018-01-26 ENCOUNTER — Encounter: Payer: Self-pay | Admitting: Family Medicine

## 2018-01-29 MED FILL — LEVONOR-ETH ESTRAD 0.1-0.02: 0.1-20 | 84 days supply | Qty: 84 | Fill #2

## 2018-01-29 MED FILL — IBUPROFEN 800 MG TAB: 800 | 20 days supply | Qty: 60 | Fill #1

## 2018-01-29 MED FILL — LEVOTHYROXINE 50 MCG TABLET: 50 | 90 days supply | Qty: 90 | Fill #0

## 2018-01-29 MED FILL — PROMETHAZINE 12.5 MG TABLET: 12.5 | 7 days supply | Qty: 30 | Fill #1

## 2018-02-04 MED FILL — HumaLOG 100 UNIT/ML SOLN: 100 | 37 days supply | Qty: 30 | Fill #2

## 2018-02-23 ENCOUNTER — Encounter: Payer: Self-pay | Admitting: Family Medicine

## 2018-02-23 ENCOUNTER — Ambulatory Visit (INDEPENDENT_AMBULATORY_CARE_PROVIDER_SITE_OTHER): Payer: 59 | Admitting: Family Medicine

## 2018-02-23 VITALS — BP 122/80 | HR 90 | Resp 16 | Ht 63.0 in | Wt 166.8 lb

## 2018-02-23 DIAGNOSIS — M6740 Ganglion, unspecified site: Secondary | ICD-10-CM | POA: Diagnosis not present

## 2018-02-23 DIAGNOSIS — R000 Tachycardia, unspecified: Secondary | ICD-10-CM | POA: Diagnosis not present

## 2018-02-23 DIAGNOSIS — R61 Generalized hyperhidrosis: Secondary | ICD-10-CM

## 2018-02-23 DIAGNOSIS — F418 Other specified anxiety disorders: Secondary | ICD-10-CM

## 2018-02-23 DIAGNOSIS — E109 Type 1 diabetes mellitus without complications: Secondary | ICD-10-CM | POA: Diagnosis not present

## 2018-02-23 DIAGNOSIS — E034 Atrophy of thyroid (acquired): Secondary | ICD-10-CM

## 2018-02-23 LAB — COMPREHENSIVE METABOLIC PANEL
ALBUMIN: 4.3 g/dL (ref 3.5–5.2)
ALT: 6 U/L (ref 0–35)
AST: 7 U/L (ref 0–37)
Alkaline Phosphatase: 27 U/L — ABNORMAL LOW (ref 39–117)
BUN: 13 mg/dL (ref 6–23)
CALCIUM: 9.4 mg/dL (ref 8.4–10.5)
CHLORIDE: 103 meq/L (ref 96–112)
CO2: 28 mEq/L (ref 19–32)
CREATININE: 0.6 mg/dL (ref 0.40–1.20)
GFR: 132.55 mL/min (ref >60.00)
Glucose, Bld: 193 mg/dL — ABNORMAL HIGH (ref 70–99)
POTASSIUM: 4 meq/L (ref 3.5–5.1)
Sodium: 138 mEq/L (ref 135–145)
Total Bilirubin: 0.3 mg/dL (ref 0.2–1.2)
Total Protein: 7.1 g/dL (ref 6.0–8.3)

## 2018-02-23 LAB — TSH: TSH: 2.69 u[IU]/mL (ref 0.35–4.50)

## 2018-02-23 LAB — CBC
HEMATOCRIT: 44.2 % (ref 36.0–46.0)
HEMOGLOBIN: 14.8 g/dL (ref 12.0–15.0)
MCHC: 33.4 g/dL (ref 30.0–36.0)
MCV: 87.2 fl (ref 78.0–100.0)
PLATELETS: 348 10*3/uL (ref 150.0–400.0)
RBC: 5.07 Mil/uL (ref 3.87–5.11)
RDW: 12.4 % (ref 11.5–15.5)
WBC: 8.2 10*3/uL (ref 4.0–10.5)

## 2018-02-23 LAB — HEMOGLOBIN A1C: HEMOGLOBIN A1C: 8.6 % — AB (ref 4.6–6.5)

## 2018-02-23 MED ORDER — SERTRALINE HCL 25 MG PO TABS: 25.0000 mg | ORAL_TABLET | Freq: Every day | ORAL | 6 refills | Status: AC

## 2018-02-23 MED FILL — SERTRALINE HCL 25 MG TABS: 25 | 30 days supply | Qty: 60 | Fill #0

## 2018-02-23 NOTE — Progress Notes (Signed)
[facility] [address], [address] [place], [place] [address] [phone] [phone]  Date:  02/23/2018   Name:  Yvette GrosserMegan A Melendez   DOB:  1996-03-20   MRN:  [id number]  PCP:  Pearline Cablesopland, Virgal Warmuth C, MD    Chief Complaint: Palpitations (heart pounding, checked pulse resting 130, off and on, negative EKG in the past); Excessive Sweating (started with BH, sweating through scrubs, possible anxiety/stress); Losing Hair; Ganglion Cyst; and Needing Pap Smear (unsure when she needs to have one, not sexually active)   History of Present Illness:  Yvette GrosserMegan A Melendez is a 22 y.o. very pleasant female patient who presents with the following:  Pt had scheduled a visit for a few concerns  She sees Dr. Everardo AllEllison for her DM1- just started seeing him  She changed to a new job in January she has noted some feeling of racing heartbeat when she is sitting quietly and not active. Mostly will notice this when at her job- really not when she is at home She has not noted any skipping beats, she may run in the 130s on the pulse ox No CP or SOB She has noted more sweating- she will sweat more easily than is normal for her. This an occur at home or at work  We did check a thyroid for her back in January She tried a clinical strength deodorant but it did not seem to make a difference She has noted some increased hair loss in her brush, etc  She will sometimes notice some nausea  She does not feel like her current job is all that stressful, so she is not sure why she is having these sx She is working 7p to 7a 3x a week at [employer], and also doing 1-2 nights at [employer]  She has always worked this many hours.  She has always worked nights so this is also not new  She has been on her OCP for several months, no change here  They made some adjustments to her basal insulin but no major changes  She has always noted a bit of anxiety, and this maybe getting worse.   She sometimes feels a little  bit down, but denies any depression  No SI We discussed a pap- she has never been SA so decided to defer for now as she is not at risk for cervical HSV infection She is not a smoker No CP or SOB No fever noted  Patient Active Problem List   Diagnosis Date Noted  . Diabetes (HCC) 01/22/2018  . CLOSED LATERAL DISLOCATION OF ELBOW 02/21/2010    Past Medical History:  Diagnosis Date  . Diabetes mellitus without complication [facility](HCC)     Past Surgical History:  Procedure Laterality Date  . WISDOM TOOTH EXTRACTION      Social History   Tobacco Use  . Smoking status: Never Smoker  . Smokeless tobacco: Never Used  Substance Use Topics  . Alcohol use: No  . Drug use: No    Family History  Problem Relation Age of Onset  . Diabetes Neg Hx     Allergies  Allergen Reactions  . Flavoring Agent Other (See Comments)    All cheeses cause anaphylaxis per mom and dad  . Morphine Nausea And Vomiting  . Tetracycline Other (See Comments)    Unknown    Medication list has been reviewed and updated.  Current Outpatient Medications on File Prior to Visit  Medication Sig Dispense  Refill  . glucagon 1 MG injection Inject 1 mg into the muscle once as needed. 1 each 12  . ibuprofen (ADVIL,MOTRIN) 800 MG tablet TAKE 1 TABLET BY MOUTH EVERY 8 HOURS AS NEEDED FOR PAIN 60 tablet 3  . insulin glargine (LANTUS) 100 UNIT/ML injection INJECT 17 UNITS UNDER THE SKIN DAILY ONLY IN CASE OF PUMP MALFUNCTION    . Insulin Human (INSULIN PUMP) SOLN Inject 1 each into the skin continuous. Basal rate 1.5    . insulin lispro (HUMALOG) 100 UNIT/ML injection Use in insulin pump as directed- up to 80 units a day 30 mL 11  . levonorgestrel-ethinyl estradiol (AVIANE,ALESSE,LESSINA) 0.1-20 MG-MCG tablet Take 1 tablet by mouth daily. Pt does not use placebo pills.  Please dispense 4 packs for a 90 day supply 4 Package 4  . levothyroxine (SYNTHROID, LEVOTHROID) 50 MCG tablet Take 50 mcg by mouth daily before  breakfast.    . promethazine (PHENERGAN) 12.5 MG tablet Take 1 tablet (12.5 mg total) by mouth every 6 (six) hours as needed for nausea or vomiting. 30 tablet 1   No current facility-administered medications on file prior to visit.     Review of Systems:  As per HPI- otherwise negative.   Physical Examination: Vitals:   02/23/18 1114  BP: 122/80  Pulse: 90  Resp: 16  SpO2: 92%   Vitals:   02/23/18 1114  Weight: 166 lb 12.8 oz (75.7 kg)  Height: 5\' 3"  (1.6 m)   Body mass index is 29.55 kg/m. Ideal Body Weight: Weight in (lb) to have BMI = 25: 140.8  GEN: WDWN, NAD, Non-toxic, A & O x 3, overweight, looks well  HEENT: Atraumatic, Normocephalic. Neck supple. No masses, No LAD.  Bilateral TM wnl, oropharynx normal.  PEERL,EOMI.   Ears and Nose: No external deformity. CV: RRR, No M/G/R. No JVD. No thrill. No extra heart sounds. PULM: CTA B, no wheezes, crackles, rhonchi. No retractions. No resp. distress. No accessory muscle use. ABD: S, NT, ND, +BS. No rebound. No HSM. EXTR: No c/c/e NEURO Normal gait.  PSYCH: Normally interactive. Conversant. Not depressed or anxious appearing.  Calm demeanor.  She also notes a likely ganglion cyst on her left palmar wrist which has been coming and going.  She would like to see hand surgery for treamtne  EKG: NSR, rate of 90 BPM Assessment and Plan: Tachycardia - Plan: EKG 12-Lead, CBC, Holter monitor - 24 hour, CANCELED: TSH  Ganglion cyst - Plan: Ambulatory referral to Hand Surgery  Type 1 diabetes mellitus without complications (HCC) - Plan: Comprehensive metabolic panel, Hemoglobin A1c  Hypothyroidism due to acquired atrophy of thyroid - Plan: TSH  Excessive sweating  Situational anxiety - Plan: sertraline (ZOLOFT) 25 MG tablet  Labs today to look for any cause of her sx Assuming ok will have her start on sertraline for anxiety Will also set up for a holter monitor due to tachycardia Referral to hand for her ganglion  cyst  Signed Abbe Amsterdam, MD  Received her labs, message to pt Your A1c is better- good progress Thyroid is in normal range Blood count is normal Metabolic profile is normal Go ahead and try the sertraline, let me know how it works for you.  I will watch for your holter monitor report  Results for orders placed or performed in visit on 02/23/18  CBC  Result Value Ref Range   WBC 8.2 4.0 - 10.5 K/uL   RBC 5.07 3.87 - 5.11 Mil/uL   Platelets 348.0  150.0 - 400.0 K/uL   Hemoglobin 14.8 12.0 - 15.0 g/dL   HCT 16.1 09.6 - 04.5 %   MCV 87.2 78.0 - 100.0 fl   MCHC 33.4 30.0 - 36.0 g/dL   RDW 40.9 81.1 - 91.4 %  Comprehensive metabolic panel  Result Value Ref Range   Sodium 138 135 - 145 mEq/L   Potassium 4.0 3.5 - 5.1 mEq/L   Chloride 103 96 - 112 mEq/L   CO2 28 19 - 32 mEq/L   Glucose, Bld 193 (H) 70 - 99 mg/dL   BUN 13 6 - 23 mg/dL   Creatinine, Ser 7.82 0.40 - 1.20 mg/dL   Total Bilirubin 0.3 0.2 - 1.2 mg/dL   Alkaline Phosphatase 27 (L) 39 - 117 U/L   AST 7 0 - 37 U/L   ALT 6 0 - 35 U/L   Total Protein 7.1 6.0 - 8.3 g/dL   Albumin 4.3 3.5 - 5.2 g/dL   Calcium 9.4 8.4 - 95.6 mg/dL   GFR 213.08 >65.78 mL/min  Hemoglobin A1c  Result Value Ref Range   Hgb A1c MFr Bld 8.6 (H) 4.6 - 6.5 %  TSH  Result Value Ref Range   TSH 2.69 0.35 - 4.50 uIU/mL

## 2018-02-23 NOTE — Patient Instructions (Addendum)
Good to see you today!  I am going to check on your labs and make sure that all looks ok- we will also get a 24 hour heart monitor for you to see what your pulse does and look for any arrhythmia.  Assuming all is ok with your labs, let's have you start on zoloft at 25 mg, increase to 2 pills after a week.  I went ahead and sent this to your pharmacy for you  Please send me a message after you have been on the zoloft for 3-4 weeks and let me know how it is working for you

## 2018-03-05 ENCOUNTER — Ambulatory Visit (INDEPENDENT_AMBULATORY_CARE_PROVIDER_SITE_OTHER): Payer: 59

## 2018-03-05 DIAGNOSIS — R000 Tachycardia, unspecified: Secondary | ICD-10-CM | POA: Diagnosis not present

## 2018-03-09 ENCOUNTER — Other Ambulatory Visit: Payer: Self-pay | Admitting: Family Medicine

## 2018-03-09 ENCOUNTER — Ambulatory Visit (INDEPENDENT_AMBULATORY_CARE_PROVIDER_SITE_OTHER): Payer: 59

## 2018-03-09 ENCOUNTER — Encounter (INDEPENDENT_AMBULATORY_CARE_PROVIDER_SITE_OTHER): Payer: Self-pay | Admitting: Orthopaedic Surgery

## 2018-03-09 ENCOUNTER — Ambulatory Visit (INDEPENDENT_AMBULATORY_CARE_PROVIDER_SITE_OTHER): Payer: 59 | Admitting: Orthopaedic Surgery

## 2018-03-09 DIAGNOSIS — R110 Nausea: Secondary | ICD-10-CM

## 2018-03-09 DIAGNOSIS — M25532 Pain in left wrist: Secondary | ICD-10-CM

## 2018-03-09 DIAGNOSIS — M67432 Ganglion, left wrist: Secondary | ICD-10-CM

## 2018-03-09 MED FILL — HumaLOG 100 UNIT/ML SOLN: 100 | 37 days supply | Qty: 30 | Fill #3

## 2018-03-09 MED FILL — IBUPROFEN 800 MG TAB: 800 | 20 days supply | Qty: 60 | Fill #2

## 2018-03-09 NOTE — Progress Notes (Signed)
Office Visit Note   Patient: Yvette Melendez           Date of Birth: [birth year]           MRN: [id number] Visit Date: 03/09/2018              Requested by: Pearline Cables, MD [address] [place], [place] [address] PCP: Pearline Cables, MD   Assessment & Plan: Visit Diagnoses:  1. Ganglion cyst of volar aspect of left wrist     Plan: Impression is left wrist volar ganglion cyst.  Aspiration was performed today which yielded approximately a cc of gelatinous fluid.  Cortisone was not injected due to previous spiking blood sugars secondary to her type 1 diabetes.  She was placed in a removable wrist splint which recommend wearing for couple weeks.  We did briefly discuss surgery and the risks and benefits.  Follow-up as needed.  Follow-Up Instructions: Return if symptoms worsen or fail to improve.   Orders:  Orders Placed This Encounter  Procedures  . XR Wrist Complete Left   No orders of the defined types were placed in this encounter.     Procedures: Hand/UE Inj: L wrist for volar carpal ganglion on 03/09/2018 3:01 PM Indications: pain Details: 25 G needle Aspirate: 1 mL Outcome: tolerated well, no immediate complications Patient was prepped and draped in the usual sterile fashion.       Clinical Data: No additional findings.   Subjective: Chief Complaint  Patient presents with  . Left Wrist - Pain    Yvette Melendez is a healthy female who comes left wrist ganglion cyst for last 5 months without any changes in size.  She first noticed it about 5 to 6 years ago.  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  She denies any injuries or constitutional symptoms.   Review of Systems  Constitutional: Negative.   HENT: Negative.   Eyes: Negative.   Respiratory: Negative.   Cardiovascular: Negative.   Endocrine: Negative.   Musculoskeletal: Negative.   Neurological: Negative.   Hematological: Negative.   Psychiatric/Behavioral: Negative.   All other  systems reviewed and are negative.    Objective: Vital Signs: LMP 02/11/2018 (Approximate)   Physical Exam  Constitutional: She is oriented to person, place, and time. She appears well-developed and well-nourished.  HENT:  Head: Normocephalic and atraumatic.  Eyes: EOM are normal.  Neck: Neck supple.  Pulmonary/Chest: Effort normal.  Abdominal: Soft.  Neurological: She is alert and oriented to person, place, and time.  Skin: Skin is warm. Capillary refill takes less than 2 seconds.  Psychiatric: She has a normal mood and affect. Her behavior is normal. Judgment and thought content normal.  Nursing note and vitals reviewed.   Ortho Exam Left wrist exam shows a palpable firm semimobile ganglion cyst approximately 1 cm diameter on the volar aspect abutting the radial artery.  There is no overlying skin changes or malignant findings.  No neurovascular compromise of the hand. Specialty Comments:  No specialty comments available.  Imaging: Xr Wrist Complete Left  Result Date: 03/09/2018 No acute or structural abnormalities.    PMFS History: Patient Active Problem List   Diagnosis Date Noted  . Diabetes (HCC) 01/22/2018  . CLOSED LATERAL DISLOCATION OF ELBOW 02/21/2010   Past Medical History:  Diagnosis Date  . Diabetes mellitus without complication (HCC)     Family History  Problem Relation Age of Onset  . Diabetes Neg Hx  Past Surgical History:  Procedure Laterality Date  . WISDOM TOOTH EXTRACTION     Social History   Occupational History  . Not on file  Tobacco Use  . Smoking status: Never Smoker  . Smokeless tobacco: Never Used  Substance and Sexual Activity  . Alcohol use: No  . Drug use: No  . Sexual activity: Not on file

## 2018-03-14 ENCOUNTER — Encounter: Payer: Self-pay | Admitting: Family Medicine

## 2018-03-14 ENCOUNTER — Other Ambulatory Visit: Payer: Self-pay | Admitting: Family Medicine

## 2018-03-14 DIAGNOSIS — R000 Tachycardia, unspecified: Secondary | ICD-10-CM

## 2018-03-14 NOTE — Progress Notes (Signed)
c 

## 2018-03-19 DIAGNOSIS — E109 Type 1 diabetes mellitus without complications: Secondary | ICD-10-CM | POA: Diagnosis not present

## 2018-03-23 ENCOUNTER — Encounter: Payer: Self-pay | Admitting: Family Medicine

## 2018-03-24 ENCOUNTER — Ambulatory Visit: Payer: 59 | Admitting: Endocrinology

## 2018-03-26 MED FILL — SERTRALINE HCL 25 MG TABS: 25 | 30 days supply | Qty: 60 | Fill #1

## 2018-03-26 MED FILL — IBUPROFEN 800 MG TAB: 800 | 20 days supply | Qty: 60 | Fill #3

## 2018-04-14 ENCOUNTER — Other Ambulatory Visit: Payer: Self-pay | Admitting: Family Medicine

## 2018-04-14 MED FILL — IBUPROFEN 800 MG TAB: 800 | 20 days supply | Qty: 60 | Fill #0

## 2018-04-14 MED FILL — LEVONOR-ETH ESTRAD 0.1-0.02: 0.1-20 | 84 days supply | Qty: 84 | Fill #3

## 2018-04-14 MED FILL — PROMETHAZINE 12.5 MG TABLET: 12.5 | 7 days supply | Qty: 30 | Fill #0

## 2018-04-14 MED FILL — HumaLOG 100 UNIT/ML SOLN: 100 | 37 days supply | Qty: 30 | Fill #4

## 2018-04-14 NOTE — Telephone Encounter (Signed)
Please refill if appropriate

## 2018-04-20 MED FILL — SERTRALINE HCL 25 MG TABS: 25 | 30 days supply | Qty: 60 | Fill #2

## 2018-04-23 MED FILL — GLUCAGON 1 MG EMERGENCY KIT: 1 | 1 days supply | Qty: 1 | Fill #1

## 2018-04-28 ENCOUNTER — Telehealth: Payer: Self-pay | Admitting: Family Medicine

## 2018-04-28 ENCOUNTER — Encounter: Payer: Self-pay | Admitting: Family Medicine

## 2018-04-28 DIAGNOSIS — R110 Nausea: Secondary | ICD-10-CM

## 2018-04-28 NOTE — Telephone Encounter (Signed)
Copied from CRM [id number]. Topic: Quick Communication - See Telephone Encounter >> Apr 28, 2018  3:43 PM Cipriano BunkerLambe, Annette S wrote: CRM for notification. See Telephone encounter for: 04/28/18.  Pt. Is wanting to speak to Dr. Patsy Lageropland about medication.  Pt. Is moving and has questions regarding medication Please call her

## 2018-04-29 ENCOUNTER — Telehealth: Payer: Self-pay | Admitting: Family Medicine

## 2018-04-29 MED ORDER — PROMETHAZINE HCL 12.5 MG PO TABS: 12.5000 mg | ORAL_TABLET | Freq: Four times a day (QID) | ORAL | 1 refills | Status: AC | PRN

## 2018-04-29 MED FILL — PROMETHAZINE 12.5 MG TABLET: 12.5 | 7 days supply | Qty: 30 | Fill #1

## 2018-04-29 MED FILL — SERTRALINE HCL 25 MG TABS: 25 | 30 days supply | Qty: 60 | Fill #3

## 2018-04-29 NOTE — Telephone Encounter (Signed)
Approved early fill with [place] [facility]

## 2018-04-29 NOTE — Telephone Encounter (Signed)
Copied from CRM [id number]. Topic: Quick Communication - See Telephone Encounter >> Apr 29, 2018  4:03 PM Arlyss Gandyichardson, Rudolph Daoust N, NT wrote: CRM for notification. See Telephone encounter for: 04/29/18. [facility] calling for approval to fill the pts Zoloft 20 days early since pt is moving out of town. CB#: [phone]

## 2018-05-01 NOTE — Telephone Encounter (Signed)
Author phoned pt to f/u on pt's medication questions per PEC note on 6/18. No answer. Yet, Dr. Patsy Lageropland on 6/18 conversed with pt. Will await pt call-back if questions not addressed previously with Dr. Patsy Lageropland.

## 2018-05-20 ENCOUNTER — Emergency Department (HOSPITAL_COMMUNITY)
Admission: EM | Admit: 2018-05-20 | Discharge: 2018-05-21 | Disposition: A | Discharge status: Home / Self Care | Payer: Self-pay | Attending: Emergency Medicine | Admitting: Emergency Medicine

## 2018-05-20 ENCOUNTER — Other Ambulatory Visit: Payer: Self-pay

## 2018-05-20 DIAGNOSIS — E119 Type 2 diabetes mellitus without complications: Secondary | ICD-10-CM | POA: Insufficient documentation

## 2018-05-20 DIAGNOSIS — Z794 Long term (current) use of insulin: Secondary | ICD-10-CM | POA: Insufficient documentation

## 2018-05-20 DIAGNOSIS — R42 Dizziness and giddiness: Secondary | ICD-10-CM | POA: Insufficient documentation

## 2018-05-20 DIAGNOSIS — Z79899 Other long term (current) drug therapy: Secondary | ICD-10-CM | POA: Insufficient documentation

## 2018-05-20 DIAGNOSIS — R002 Palpitations: Secondary | ICD-10-CM | POA: Insufficient documentation

## 2018-05-20 DIAGNOSIS — R079 Chest pain, unspecified: Secondary | ICD-10-CM | POA: Insufficient documentation

## 2018-05-20 DIAGNOSIS — R0602 Shortness of breath: Secondary | ICD-10-CM | POA: Insufficient documentation

## 2018-05-20 DIAGNOSIS — Z7982 Long term (current) use of aspirin: Secondary | ICD-10-CM | POA: Insufficient documentation

## 2018-05-20 NOTE — ED Triage Notes (Addendum)
Pt from home with c/o central c/p that began 1 week and 1 day ago. Pt denies sob. Pt states pain has been persistent since onset. Pt has hx of DM but denies smoking or htn. Pt states she would only like labs drawn for CP at this time (would not like EKG/ x-ray at present). Pt also reports intermittent tachycardia and has a manual radial pulse of 115 at time of assessment

## 2018-05-20 NOTE — ED Provider Notes (Signed)
[facility]-EMERGENCY DEPT Provider Note   CSN: [id number] Arrival date & time: 05/20/18  2059     History   Chief Complaint Chief Complaint  Patient presents with  . Chest Pain    HPI Yvette Melendez is a 22 y.o. female.  22 year old female with history of IDDM presents to the emergency department for evaluation of palpitations.  Patient has been experiencing palpitations over the past 3 months.  These come sporadically and wax and wane in severity.  Her palpitations are characterized as a rapid, racing heartbeat.  It will get as high as the 130s when she checks it manually.  She notes onset of a more central chest tightness 1 week ago.  This lasted for approximately 1 hour before spontaneously resolving.  She states that her palpitations have been coming more frequently since this time, at least daily for the past 7 days.  She has been taking a baby aspirin as a precaution.  Will feel lightheaded with mild short of breath at times.  No associated syncope, near syncope, vomiting, leg swelling.  She previously underwent Holter monitoring in May which did not show any abnormalities besides sporadic tachycardia.  No recent fevers, viral illness, or FHx of sudden cardiac death.     Past Medical History:  Diagnosis Date  . Diabetes mellitus without complication [facility](HCC)     Patient Active Problem List   Diagnosis Date Noted  . Diabetes (HCC) 01/22/2018  . CLOSED LATERAL DISLOCATION OF ELBOW 02/21/2010    Past Surgical History:  Procedure Laterality Date  . WISDOM TOOTH EXTRACTION       OB History   None      Home Medications    Prior to Admission medications   Medication Sig Start Date End Date Taking? Authorizing Provider  aspirin 81 MG chewable tablet Chew 81 mg by mouth once.   Yes [provider]  glucagon 1 MG injection Inject 1 mg into the muscle once as needed. 08/21/17  Yes Copland, Gwenlyn FoundJessica C, MD  ibuprofen (ADVIL,MOTRIN) 800 MG tablet  TAKE 1 TABLET BY MOUTH EVERY 8 HOURS AS NEEDED FOR PAIN 04/14/18  Yes Copland, Gwenlyn FoundJessica C, MD  insulin glargine (LANTUS) 100 UNIT/ML injection INJECT 17 UNITS UNDER THE SKIN DAILY ONLY IN CASE OF PUMP MALFUNCTION 09/24/16  Yes [provider]  Insulin Human (INSULIN PUMP) SOLN Inject 1 each into the skin continuous. Basal rate 1.5   Yes [provider]  insulin lispro (HUMALOG) 100 UNIT/ML injection Use in insulin pump as directed- up to 80 units a day 12/08/17  Yes Copland, Gwenlyn FoundJessica C, MD  levonorgestrel-ethinyl estradiol (AVIANE,ALESSE,LESSINA) 0.1-20 MG-MCG tablet Take 1 tablet by mouth daily. Pt does not use placebo pills.  Please dispense 4 packs for a 90 day supply 10/23/17  Yes Copland, Gwenlyn FoundJessica C, MD  levothyroxine (SYNTHROID, LEVOTHROID) 50 MCG tablet Take 50 mcg by mouth daily before breakfast.   Yes [provider]  promethazine (PHENERGAN) 12.5 MG tablet Take 1 tablet (12.5 mg total) by mouth every 6 (six) hours as needed for nausea or vomiting. 04/29/18  Yes Copland, Gwenlyn FoundJessica C, MD  sertraline (ZOLOFT) 25 MG tablet Take 1 tablet (25 mg total) by mouth daily. Increase to 50 mg after one week 02/23/18  Yes Copland, Gwenlyn FoundJessica C, MD    Family History Family History  Problem Relation Age of Onset  . Diabetes Neg Hx     Social History Social History   Tobacco Use  . Smoking status: Never  Smoker  . Smokeless tobacco: Never Used  Substance Use Topics  . Alcohol use: No  . Drug use: No     Allergies   Flavoring agent; Morphine; and Tetracycline   Review of Systems Review of Systems Ten systems reviewed and are negative for acute change, except as noted in the HPI.    Physical Exam Updated Vital Signs BP 112/82   Pulse 98   Temp 98.4 F (36.9 C)   Resp 18   SpO2 99%   Physical Exam  Constitutional: She is oriented to person, place, and time. She appears well-developed and well-nourished. No distress.  Nontoxic appearing and in NAD  HENT:  Head:  Normocephalic and atraumatic.  Eyes: Conjunctivae and EOM are normal. No scleral icterus.  Neck: Normal range of motion.  Cardiovascular: Normal rate, regular rhythm and intact distal pulses.  Not tachycardic as noted in triage  Pulmonary/Chest: Effort normal. No stridor. No respiratory distress. She has no wheezes. She has no rales.  Respirations even and unlabored. Lungs CTAB.  Musculoskeletal: Normal range of motion.  Neurological: She is alert and oriented to person, place, and time. She exhibits normal muscle tone. Coordination normal.  GCS 15. Patient moving all extremities.  Skin: Skin is warm and dry. No rash noted. She is not diaphoretic. No erythema. No pallor.  Psychiatric: She has a normal mood and affect. Her behavior is normal.  Nursing note and vitals reviewed.    ED Treatments / Results  Labs (all labs ordered are listed, but only abnormal results are displayed) Labs Reviewed  BASIC METABOLIC PANEL - Abnormal; Notable for the following components:      Result Value   Glucose, Bld 249 (*)    All other components within normal limits  CBC - Abnormal; Notable for the following components:   RBC 5.45 (*)    Hemoglobin 15.7 (*)    HCT 46.5 (*)    Platelets 403 (*)    All other components within normal limits  D-DIMER, QUANTITATIVE (NOT AT [facility]) - Abnormal; Notable for the following components:   D-Dimer, Quant 0.77 (*)    All other components within normal limits  I-STAT TROPONIN, ED  I-STAT BETA HCG BLOOD, ED ([facility], [facility], [facility] ONLY)    EKG EKG Interpretation  Date/Time:  Thursday May 21 2018 00:42:29 EDT Ventricular Rate:  94 PR Interval:    QRS Duration: 81 QT Interval:  329 QTC Calculation: 412 R Axis:   75 Text Interpretation:  Sinus rhythm S1Q3T3 NO STEMI No old tracing to compare Confirmed by Drema Pry [phone]) on 05/21/2018 1:08:15 AM   Radiology Ct Angio Chest Pe W And/or Wo Contrast  Result Date: 05/21/2018 CLINICAL DATA:  Central chest pain and  palpitations. EXAM: CT ANGIOGRAPHY CHEST WITH CONTRAST TECHNIQUE: Multidetector CT imaging of the chest was performed using the standard protocol during bolus administration of intravenous contrast. Multiplanar CT image reconstructions and MIPs were obtained to evaluate the vascular anatomy. CONTRAST:  ISOVUE-370 IOPAMIDOL (ISOVUE-370) INJECTION 76% COMPARISON:  No recent exams.  Radiograph 03/17/2017 FINDINGS: Cardiovascular: There are no filling defects within the pulmonary arteries to suggest pulmonary embolus. Thoracic aorta is normal in caliber without dissection. Heart is normal in size. No pericardial effusion. Mediastinum/Nodes: No enlarged mediastinal or hilar lymph nodes. No visualized thyroid nodule. The esophagus is decompressed. Lungs/Pleura: Mild motion artifact. No consolidation, pulmonary edema or pleural fluid. Trachea and mainstem bronchi are patent. Upper Abdomen: No acute abnormality. Musculoskeletal: No chest wall abnormality. No acute or  significant osseous findings. Review of the MIP images confirms the above findings. IMPRESSION: No pulmonary embolus or acute intrathoracic abnormality. Electronically Signed   By: Rubye Oaks M.D.   On: 05/21/2018 02:07    Procedures Procedures (including critical care time)  Medications Ordered in ED Medications  iopamidol (ISOVUE-370) 76 % injection (has no administration in time range)  iopamidol (ISOVUE-370) 76 % injection 100 mL (100 mLs Intravenous Contrast Given 05/21/18 0148)     Initial Impression / Assessment and Plan / ED Course  I have reviewed the triage vital signs and the nursing notes.  Pertinent labs & imaging results that were available during my care of the patient were reviewed by me and considered in my medical decision making (see chart for details).     Patient presents to the emergency department for evaluation of chest pain.  Low suspicion for emergent cardiac etiology.  EKG is nonischemic and troponin  negative.  Patient has a heart score of 1 consistent with low risk of acute coronary event.  Chest x-ray without evidence of mediastinal widening to suggest dissection.  No pneumothorax, pneumonia, pleural effusion.  Pulmonary embolus further considered.  Given tachycardia and use of BCPs, D dimer ordered and found to be elevated. Subsequent CT scan of the chest is negative for acute PE.  Given reassuring evaluation and symptom chronicity, I believe it is appropriate for the patient to continue follow-up with cardiology on an outpatient basis.  Referral provided, though patient may choose to follow-up at a later date given that she is planning to move to [place] at the end of the month.  Have advised that she remain well-hydrated by drinking plenty of water.  Advised that she avoid consumption of caffeine.  Return precautions discussed and provided. Patient discharged in stable condition with no unaddressed concerns.   Vitals:   05/20/18 2142 05/21/18 0130 05/21/18 0230  BP: (!) 148/85 120/82 112/82  Pulse: (!) 115 90 98  Resp: 16 13 18   Temp: 98.4 F (36.9 C)    SpO2: 100% 100% 99%    Final Clinical Impressions(s) / ED Diagnoses   Final diagnoses:  Palpitations    ED Discharge Orders    None       Antony Madura, PA-C 05/21/18 0302    Nira Conn, MD 05/21/18 [phone]

## 2018-05-21 ENCOUNTER — Encounter (HOSPITAL_COMMUNITY): Payer: Self-pay

## 2018-05-21 ENCOUNTER — Emergency Department (HOSPITAL_COMMUNITY): Payer: Self-pay

## 2018-05-21 LAB — I-STAT BETA HCG BLOOD, ED (MC, WL, AP ONLY)

## 2018-05-21 LAB — BASIC METABOLIC PANEL
Anion gap: 11 (ref 5–15)
BUN: 10 mg/dL (ref 6–20)
CHLORIDE: 104 mmol/L (ref 98–111)
CO2: 26 mmol/L (ref 22–32)
Calcium: 10.2 mg/dL (ref 8.9–10.3)
Creatinine, Ser: 0.73 mg/dL (ref 0.44–1.00)
Glucose, Bld: 249 mg/dL — ABNORMAL HIGH (ref 70–99)
POTASSIUM: 4 mmol/L (ref 3.5–5.1)
SODIUM: 141 mmol/L (ref 135–145)

## 2018-05-21 LAB — CBC
HEMATOCRIT: 46.5 % — AB (ref 36.0–46.0)
Hemoglobin: 15.7 g/dL — ABNORMAL HIGH (ref 12.0–15.0)
MCH: 28.8 pg (ref 26.0–34.0)
MCHC: 33.8 g/dL (ref 30.0–36.0)
MCV: 85.3 fL (ref 78.0–100.0)
PLATELETS: 403 10*3/uL — AB (ref 150–400)
RBC: 5.45 MIL/uL — AB (ref 3.87–5.11)
RDW: 12.4 % (ref 11.5–15.5)
WBC: 10.5 10*3/uL (ref 4.0–10.5)

## 2018-05-21 LAB — I-STAT TROPONIN, ED: Troponin i, poc: 0 ng/mL (ref 0.00–0.08)

## 2018-05-21 LAB — D-DIMER, QUANTITATIVE: D-Dimer, Quant: 0.77 ug/mL-FEU — ABNORMAL HIGH (ref 0.00–0.50)

## 2018-05-21 MED ORDER — IOPAMIDOL (ISOVUE-370) INJECTION 76%
INTRAVENOUS | Status: AC
  Filled 2018-05-21: qty 100

## 2018-05-21 MED ORDER — IOPAMIDOL (ISOVUE-370) INJECTION 76%
100.0000 mL | Freq: Once | INTRAVENOUS | Status: AC | PRN
  Administered 2018-05-21: 100 mL via INTRAVENOUS

## 2018-05-21 NOTE — Discharge Instructions (Addendum)
Your work-up today was reassuring and your CT did not show evidence of a blood clot in your lungs.  We recommend follow-up with a cardiologist as well as continued evaluation by a primary care doctor.  You may return for new or concerning symptoms.

## 2018-05-21 NOTE — ED Notes (Signed)
EKG given to EDP,Cardama,MD., for review. 

## 2018-05-29 ENCOUNTER — Encounter: Payer: Self-pay | Admitting: Family Medicine

## 2018-06-01 ENCOUNTER — Encounter: Payer: Self-pay | Admitting: Family Medicine

## 2018-06-11 ENCOUNTER — Encounter: Payer: Self-pay | Admitting: Family Medicine

## 2018-06-11 DIAGNOSIS — E109 Type 1 diabetes mellitus without complications: Secondary | ICD-10-CM

## 2018-06-11 MED ORDER — INSULIN LISPRO 100 UNIT/ML ~~LOC~~ SOLN: SUBCUTANEOUS | 11 refills | Status: DC

## 2018-06-17 ENCOUNTER — Encounter: Payer: Self-pay | Admitting: Family Medicine

## 2018-08-12 ENCOUNTER — Telehealth: Payer: Self-pay

## 2018-08-12 NOTE — Telephone Encounter (Signed)
Ok, Please refill novolog x 1 Ov is due

## 2018-08-12 NOTE — Telephone Encounter (Signed)
Received PA for Humalog- insurance covers Novolog.

## 2018-08-13 ENCOUNTER — Other Ambulatory Visit: Payer: Self-pay

## 2018-08-13 MED ORDER — INSULIN ASPART 100 UNIT/ML ~~LOC~~ SOLN: SUBCUTANEOUS | 99 refills | Status: AC

## 2018-08-13 NOTE — Telephone Encounter (Signed)
done

## 2019-01-24 IMAGING — CT CT ANGIO CHEST
2 of 6 series · 18 of 46 positions shown · IV contrast (ISOVUE 370)
Comparison: No recent exams.  Radiograph 03/17/2017

CLINICAL DATA: Central chest pain and palpitations.

EXAM:
CT ANGIOGRAPHY CHEST WITH CONTRAST
TECHNIQUE: Multidetector CT imaging of the chest was performed using the
standard protocol during bolus administration of intravenous
contrast. Multiplanar CT image reconstructions and MIPs were
obtained to evaluate the vascular anatomy.
CONTRAST:  100mL IOI0D0-DQQ IOPAMIDOL (IOI0D0-DQQ) INJECTION 76%

[Series 5: thins · axial · 0.62mm/px · z∈[-6,+178]mm · 15 of 202 slices shown]
[im 9/202  lung]
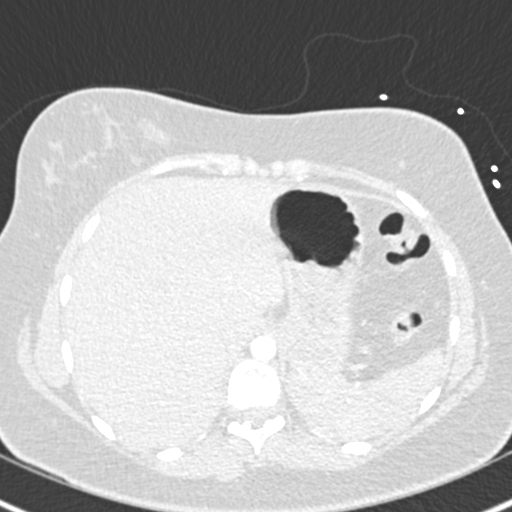
[im 27/202  soft-tissue]
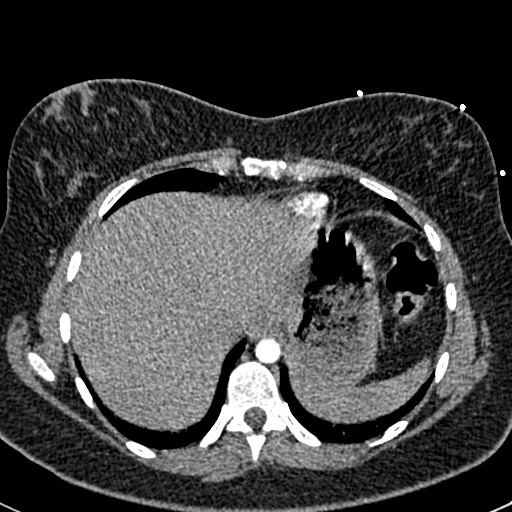
[im 35/202  lung]
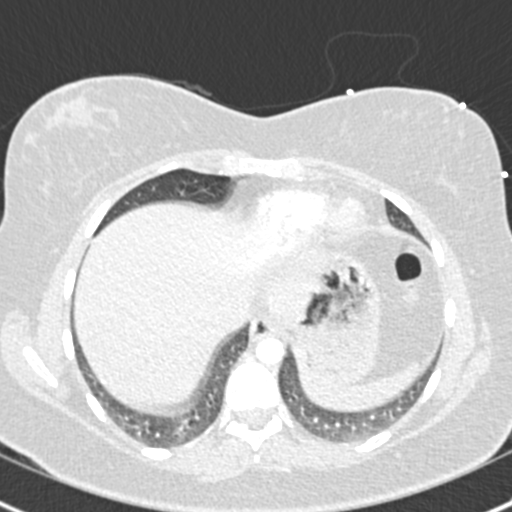
[im 53/202  soft-tissue]
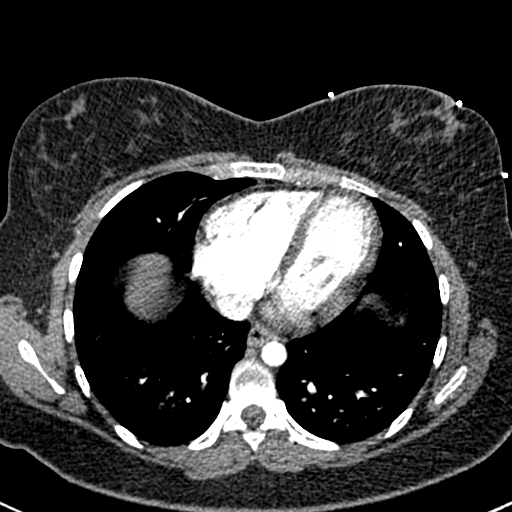
[im 62/202  lung]
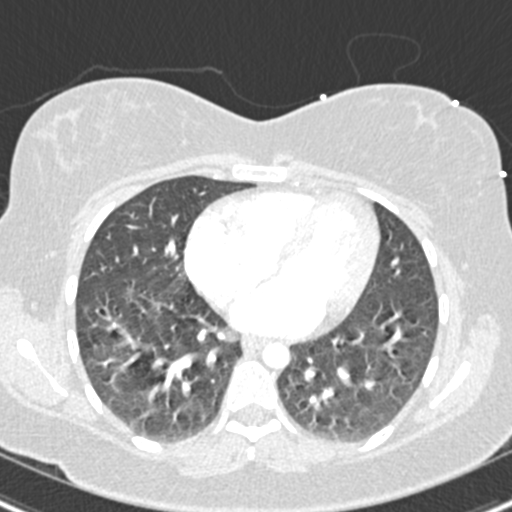
[im 79/202  soft-tissue]
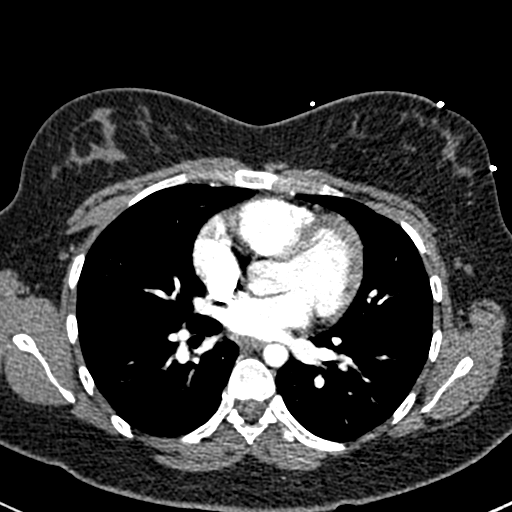
[im 88/202  lung]
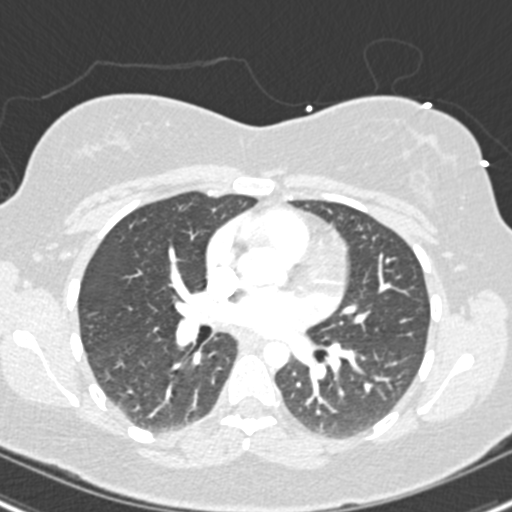
[im 105/202  soft-tissue]
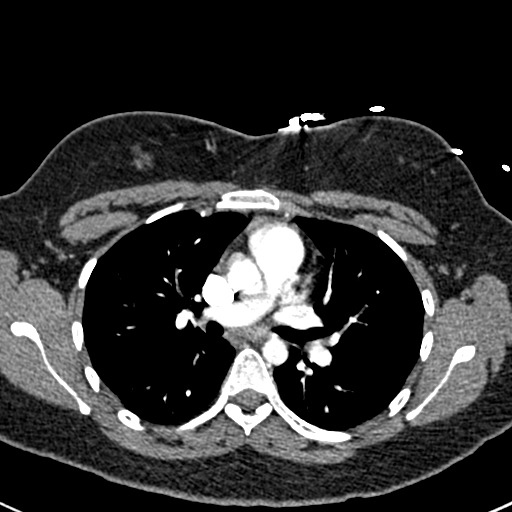
[im 114/202  lung]
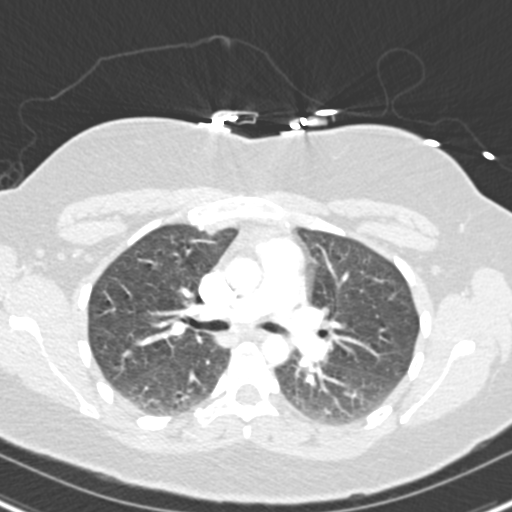
[im 123/202  soft-tissue]
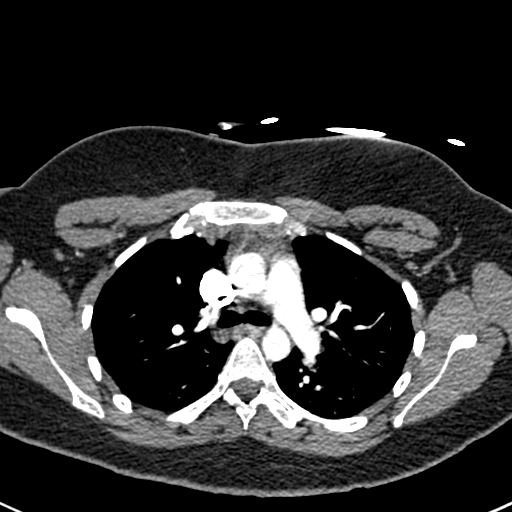
[im 140/202  lung]
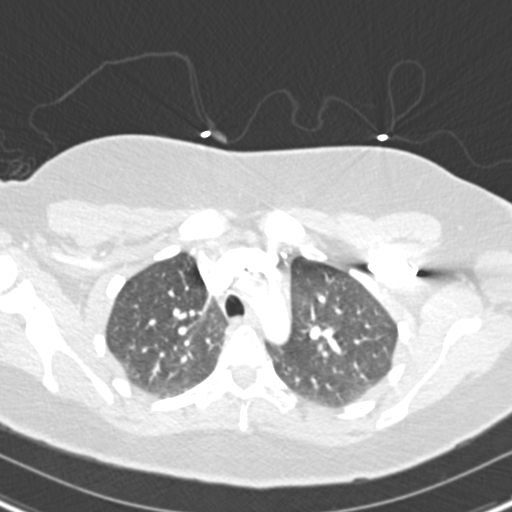
[im 149/202  soft-tissue]
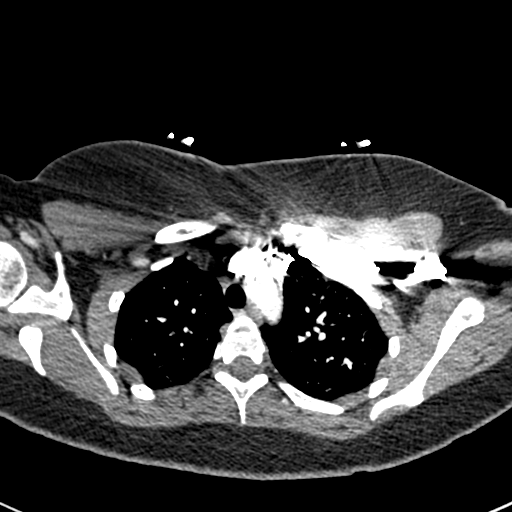
[im 167/202  lung]
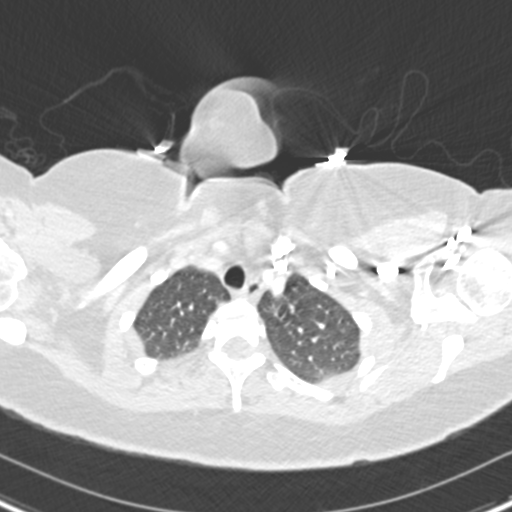
[im 175/202  soft-tissue]
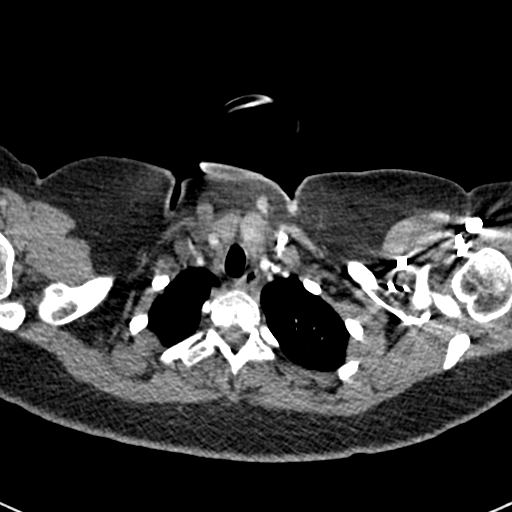
[im 193/202  lung]
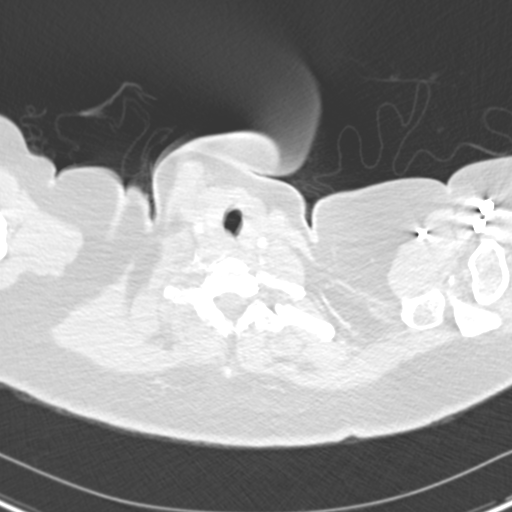

[Series 7: coronal mpr · coronal · 0.39mm/px · 3 of 117 slices shown]
[im 30/117  soft-tissue]
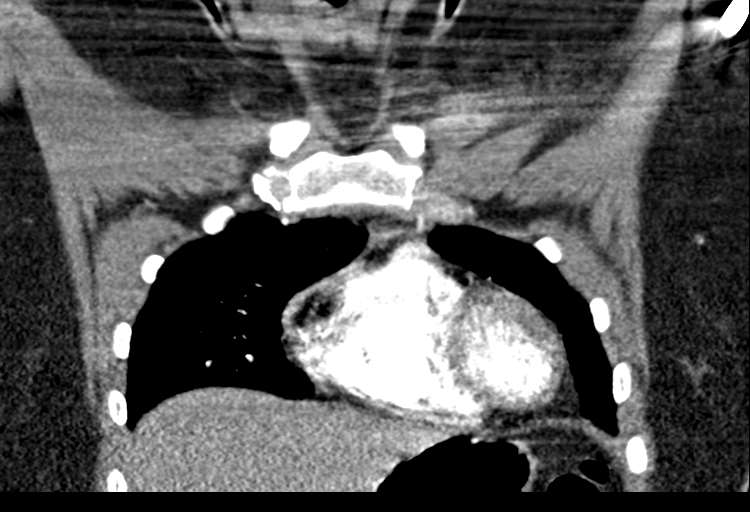
[im 59/117  soft-tissue]
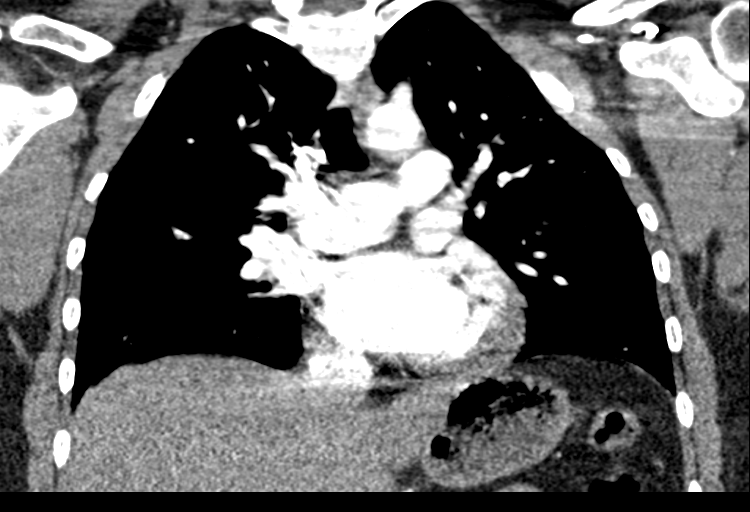
[im 88/117  soft-tissue]
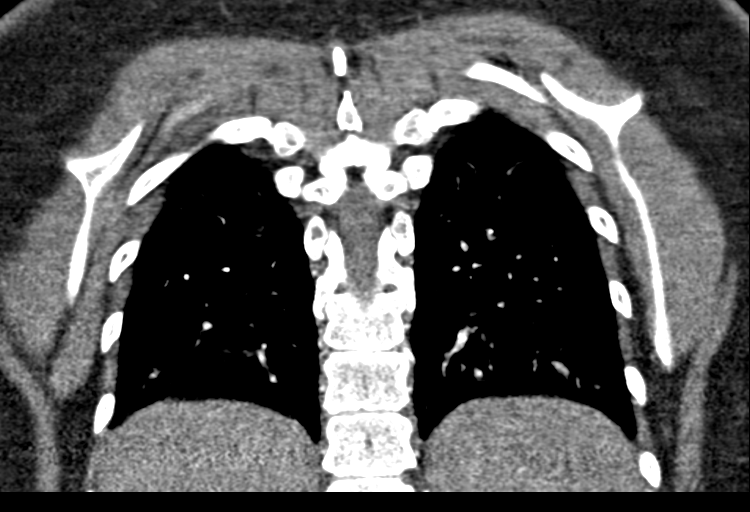

[18 of 46 positions shown; findings below may reference images not displayed]

FINDINGS: Cardiovascular: There are no filling defects within the pulmonary
arteries to suggest pulmonary embolus. Thoracic aorta is normal in
caliber without dissection. Heart is normal in size. No pericardial
effusion.

Mediastinum/Nodes: No enlarged mediastinal or hilar lymph nodes. No
visualized thyroid nodule. The esophagus is decompressed.

Lungs/Pleura: Mild motion artifact. No consolidation, pulmonary
edema or pleural fluid. Trachea and mainstem bronchi are patent.

Upper Abdomen: No acute abnormality.

Musculoskeletal: No chest wall abnormality. No acute or significant
osseous findings.

Review of the MIP images confirms the above findings.
IMPRESSION: No pulmonary embolus or acute intrathoracic abnormality.
